# Patient Record
Sex: Male | Born: 1980 | Race: White | Hispanic: No | Marital: Married | Smoking: Never smoker
Health system: Southern US, Community
[De-identification: ages and names within clinical notes are randomized; demographics above are authoritative.]

---

## 2003-02-10 ENCOUNTER — Emergency Department (HOSPITAL_COMMUNITY): Admission: EM | Admit: 2003-02-10 | Discharge: 2003-02-10 | Payer: Self-pay

## 2008-09-03 ENCOUNTER — Emergency Department (HOSPITAL_BASED_OUTPATIENT_CLINIC_OR_DEPARTMENT_OTHER): Admission: EM | Admit: 2008-09-03 | Discharge: 2008-09-03 | Payer: Self-pay | Admitting: Emergency Medicine

## 2008-09-03 ENCOUNTER — Ambulatory Visit: Payer: Self-pay | Admitting: Radiology

## 2008-10-01 ENCOUNTER — Ambulatory Visit: Payer: Self-pay | Admitting: Diagnostic Radiology

## 2008-10-01 ENCOUNTER — Inpatient Hospital Stay (HOSPITAL_COMMUNITY): Admission: AD | Admit: 2008-10-01 | Discharge: 2008-10-11 | Payer: Self-pay | Admitting: Internal Medicine

## 2008-10-01 ENCOUNTER — Encounter: Payer: Self-pay | Admitting: Emergency Medicine

## 2008-10-02 ENCOUNTER — Encounter (INDEPENDENT_AMBULATORY_CARE_PROVIDER_SITE_OTHER): Payer: Self-pay | Admitting: Internal Medicine

## 2008-10-02 ENCOUNTER — Ambulatory Visit: Payer: Self-pay | Admitting: Vascular Surgery

## 2008-10-04 ENCOUNTER — Ambulatory Visit: Payer: Self-pay | Admitting: Infectious Diseases

## 2009-02-03 ENCOUNTER — Ambulatory Visit: Payer: Self-pay | Admitting: Vascular Surgery

## 2010-04-07 LAB — HEPARIN LEVEL (UNFRACTIONATED)
Heparin Unfractionated: 0.12 IU/mL — ABNORMAL LOW (ref 0.30–0.70)
Heparin Unfractionated: 0.14 IU/mL — ABNORMAL LOW (ref 0.30–0.70)
Heparin Unfractionated: 0.67 IU/mL (ref 0.30–0.70)
Heparin Unfractionated: 0.73 IU/mL — ABNORMAL HIGH (ref 0.30–0.70)
Heparin Unfractionated: 0.91 IU/mL — ABNORMAL HIGH (ref 0.30–0.70)
Heparin Unfractionated: 0.98 IU/mL — ABNORMAL HIGH (ref 0.30–0.70)
Heparin Unfractionated: 1.06 IU/mL — ABNORMAL HIGH (ref 0.30–0.70)
Heparin Unfractionated: <0.1 IU/mL — ABNORMAL LOW (ref 0.30–0.70)
Heparin Unfractionated: <0.1 IU/mL — ABNORMAL LOW (ref 0.30–0.70)
Heparin Unfractionated: <0.1 IU/mL — ABNORMAL LOW (ref 0.30–0.70)

## 2010-04-07 LAB — HEPATIC FUNCTION PANEL
ALT: 27 U/L (ref 0–53)
Albumin: 3.2 g/dL — ABNORMAL LOW (ref 3.5–5.2)
Alkaline Phosphatase: 54 U/L (ref 39–117)
Bilirubin, Direct: 0.3 mg/dL (ref 0.0–0.3)
Indirect Bilirubin: 1.3 mg/dL — ABNORMAL HIGH (ref 0.3–0.9)
Total Bilirubin: 1.6 mg/dL — ABNORMAL HIGH (ref 0.3–1.2)

## 2010-04-07 LAB — COMPREHENSIVE METABOLIC PANEL
AST: 15 U/L (ref 0–37)
Albumin: 2.7 g/dL — ABNORMAL LOW (ref 3.5–5.2)
Alkaline Phosphatase: 41 U/L (ref 39–117)
CO2: 27 mEq/L (ref 19–32)
Calcium: 7.9 mg/dL — ABNORMAL LOW (ref 8.4–10.5)
GFR calc Af Amer: >60 mL/min (ref >60)
Glucose, Bld: 104 mg/dL — ABNORMAL HIGH (ref 70–99)
Potassium: 3.3 mEq/L — ABNORMAL LOW (ref 3.5–5.1)
Sodium: 139 mEq/L (ref 135–145)
Total Bilirubin: 0.8 mg/dL (ref 0.3–1.2)
Total Protein: 5.5 g/dL — ABNORMAL LOW (ref 6.0–8.3)

## 2010-04-07 LAB — CBC
HCT: 33 % — ABNORMAL LOW (ref 39.0–52.0)
HCT: 34 % — ABNORMAL LOW (ref 39.0–52.0)
HCT: 36.2 % — ABNORMAL LOW (ref 39.0–52.0)
HCT: 37.5 % — ABNORMAL LOW (ref 39.0–52.0)
HCT: 38.4 % — ABNORMAL LOW (ref 39.0–52.0)
Hemoglobin: 11.7 g/dL — ABNORMAL LOW (ref 13.0–17.0)
Hemoglobin: 12.5 g/dL — ABNORMAL LOW (ref 13.0–17.0)
Hemoglobin: 13 g/dL (ref 13.0–17.0)
Hemoglobin: 13.1 g/dL (ref 13.0–17.0)
Hemoglobin: 13.5 g/dL (ref 13.0–17.0)
Hemoglobin: 13.5 g/dL (ref 13.0–17.0)
MCHC: 34.6 g/dL (ref 30.0–36.0)
MCHC: 34.8 g/dL (ref 30.0–36.0)
MCHC: 35 g/dL (ref 30.0–36.0)
MCHC: 35.1 g/dL (ref 30.0–36.0)
MCHC: 35.3 g/dL (ref 30.0–36.0)
MCHC: 35.4 g/dL (ref 30.0–36.0)
MCHC: 35.5 g/dL (ref 30.0–36.0)
MCHC: 35.6 g/dL (ref 30.0–36.0)
MCV: 90.9 fL (ref 78.0–100.0)
MCV: 91.1 fL (ref 78.0–100.0)
MCV: 91.2 fL (ref 78.0–100.0)
MCV: 91.2 fL (ref 78.0–100.0)
MCV: 91.5 fL (ref 78.0–100.0)
MCV: 91.9 fL (ref 78.0–100.0)
MCV: 92.9 fL (ref 78.0–100.0)
MCV: 92.9 fL (ref 78.0–100.0)
Platelets: 109 K/uL — ABNORMAL LOW (ref 150–400)
Platelets: 119 K/uL — ABNORMAL LOW (ref 150–400)
Platelets: 124 K/uL — ABNORMAL LOW (ref 150–400)
Platelets: 147 10*3/uL — ABNORMAL LOW (ref 150–400)
Platelets: 158 10*3/uL (ref 150–400)
Platelets: 195 10*3/uL (ref 150–400)
Platelets: 218 10*3/uL (ref 150–400)
Platelets: 235 10*3/uL (ref 150–400)
RBC: 3.58 MIL/uL — ABNORMAL LOW (ref 4.22–5.81)
RBC: 3.59 MIL/uL — ABNORMAL LOW (ref 4.22–5.81)
RBC: 3.73 MIL/uL — ABNORMAL LOW (ref 4.22–5.81)
RBC: 3.98 MIL/uL — ABNORMAL LOW (ref 4.22–5.81)
RBC: 4.03 MIL/uL — ABNORMAL LOW (ref 4.22–5.81)
RBC: 4.13 MIL/uL — ABNORMAL LOW (ref 4.22–5.81)
RBC: 4.26 MIL/uL (ref 4.22–5.81)
RDW: 12.9 % (ref 11.5–15.5)
RDW: 13 % (ref 11.5–15.5)
RDW: 13.1 % (ref 11.5–15.5)
RDW: 13.1 % (ref 11.5–15.5)
RDW: 13.4 % (ref 11.5–15.5)
RDW: 13.4 % (ref 11.5–15.5)
RDW: 13.6 % (ref 11.5–15.5)
WBC: 10.7 10*3/uL — ABNORMAL HIGH (ref 4.0–10.5)
WBC: 10.8 K/uL — ABNORMAL HIGH (ref 4.0–10.5)
WBC: 11.8 K/uL — ABNORMAL HIGH (ref 4.0–10.5)
WBC: 6.1 10*3/uL (ref 4.0–10.5)
WBC: 6.9 K/uL (ref 4.0–10.5)
WBC: 7.3 10*3/uL (ref 4.0–10.5)

## 2010-04-07 LAB — BASIC METABOLIC PANEL WITH GFR
BUN: 9 mg/dL (ref 6–23)
CO2: 25 meq/L (ref 19–32)
Calcium: 8 mg/dL — ABNORMAL LOW (ref 8.4–10.5)
Chloride: 104 meq/L (ref 96–112)
Creatinine, Ser: 1.37 mg/dL (ref 0.4–1.5)
GFR calc non Af Amer: >60 mL/min
Glucose, Bld: 97 mg/dL (ref 70–99)
Potassium: 3.2 meq/L — ABNORMAL LOW (ref 3.5–5.1)
Sodium: 136 meq/L (ref 135–145)

## 2010-04-07 LAB — BASIC METABOLIC PANEL
BUN: 5 mg/dL — ABNORMAL LOW (ref 6–23)
BUN: 6 mg/dL (ref 6–23)
BUN: 7 mg/dL (ref 6–23)
CO2: 26 mEq/L (ref 19–32)
CO2: 29 mEq/L (ref 19–32)
CO2: 30 mEq/L (ref 19–32)
Calcium: 8.4 mg/dL (ref 8.4–10.5)
Calcium: 8.4 mg/dL (ref 8.4–10.5)
Calcium: 8.9 mg/dL (ref 8.4–10.5)
Chloride: 103 mEq/L (ref 96–112)
Chloride: 104 mEq/L (ref 96–112)
Chloride: 104 mEq/L (ref 96–112)
Creatinine, Ser: 1.13 mg/dL (ref 0.4–1.5)
Creatinine, Ser: 1.27 mg/dL (ref 0.4–1.5)
Creatinine, Ser: 1.35 mg/dL (ref 0.4–1.5)
Creatinine, Ser: 1.36 mg/dL (ref 0.4–1.5)
GFR calc Af Amer: >60 mL/min (ref >60)
GFR calc non Af Amer: >60 mL/min (ref >60)
Glucose, Bld: 102 mg/dL — ABNORMAL HIGH (ref 70–99)
Glucose, Bld: 97 mg/dL (ref 70–99)
Potassium: 3.2 mEq/L — ABNORMAL LOW (ref 3.5–5.1)
Potassium: 3.3 mEq/L — ABNORMAL LOW (ref 3.5–5.1)
Sodium: 136 mEq/L (ref 135–145)
Sodium: 139 mEq/L (ref 135–145)

## 2010-04-07 LAB — DIFFERENTIAL
Basophils Absolute: 0 K/uL (ref 0.0–0.1)
Basophils Relative: 0 % (ref 0–1)
Eosinophils Absolute: 0 K/uL (ref 0.0–0.7)
Eosinophils Relative: 0 % (ref 0–5)
Lymphocytes Relative: 9 % — ABNORMAL LOW (ref 12–46)
Lymphs Abs: 1.1 K/uL (ref 0.7–4.0)
Monocytes Absolute: 0.9 K/uL (ref 0.1–1.0)
Monocytes Relative: 7 % (ref 3–12)
Neutro Abs: 9.8 K/uL — ABNORMAL HIGH (ref 1.7–7.7)
Neutrophils Relative %: 83 % — ABNORMAL HIGH (ref 43–77)

## 2010-04-07 LAB — CULTURE, ROUTINE-ABSCESS

## 2010-04-07 LAB — CK: Total CK: 107 U/L (ref 7–232)

## 2010-04-07 LAB — PROTIME-INR
INR: 1.23 (ref 0.00–1.49)
Prothrombin Time: 15.4 seconds — ABNORMAL HIGH (ref 11.6–15.2)

## 2010-04-07 LAB — CK TOTAL AND CKMB (NOT AT ARMC)
CK, MB: 1 ng/mL (ref 0.3–4.0)
Relative Index: 1 (ref 0.0–2.5)
Total CK: 104 U/L (ref 7–232)

## 2010-04-07 LAB — VANCOMYCIN, TROUGH: Vancomycin Tr: <5 ug/mL — ABNORMAL LOW (ref 10.0–20.0)

## 2010-04-07 LAB — ANAEROBIC CULTURE

## 2010-04-07 LAB — APTT: aPTT: 30 seconds (ref 24–37)

## 2010-04-08 LAB — URINALYSIS, ROUTINE W REFLEX MICROSCOPIC
Bilirubin Urine: NEGATIVE
Glucose, UA: NEGATIVE mg/dL
Ketones, ur: NEGATIVE mg/dL
Leukocytes, UA: NEGATIVE
Nitrite: NEGATIVE
Protein, ur: NEGATIVE mg/dL
Specific Gravity, Urine: 1.02 (ref 1.005–1.030)
Urobilinogen, UA: 1 mg/dL (ref 0.0–1.0)
pH: 7 (ref 5.0–8.0)

## 2010-04-08 LAB — CULTURE, BLOOD (ROUTINE X 2): Culture: NO GROWTH

## 2010-04-08 LAB — URINE MICROSCOPIC-ADD ON

## 2010-04-08 LAB — CBC
HCT: 45.5 % (ref 39.0–52.0)
Hemoglobin: 15.9 g/dL (ref 13.0–17.0)
MCHC: 34.9 g/dL (ref 30.0–36.0)
MCV: 91.2 fL (ref 78.0–100.0)
Platelets: 139 10*3/uL — ABNORMAL LOW (ref 150–400)
RBC: 4.99 MIL/uL (ref 4.22–5.81)
RDW: 12.4 % (ref 11.5–15.5)
WBC: 14.6 10*3/uL — ABNORMAL HIGH (ref 4.0–10.5)

## 2010-04-08 LAB — BASIC METABOLIC PANEL
BUN: 13 mg/dL (ref 6–23)
CO2: 30 mEq/L (ref 19–32)
Calcium: 9.2 mg/dL (ref 8.4–10.5)
Chloride: 102 mEq/L (ref 96–112)
Creatinine, Ser: 1.3 mg/dL (ref 0.4–1.5)
GFR calc Af Amer: >60 mL/min (ref >60)
GFR calc non Af Amer: >60 mL/min (ref >60)
Glucose, Bld: 78 mg/dL (ref 70–99)
Potassium: 3.8 mEq/L (ref 3.5–5.1)
Sodium: 142 mEq/L (ref 135–145)

## 2010-04-08 LAB — DIFFERENTIAL
Basophils Absolute: 0.6 10*3/uL — ABNORMAL HIGH (ref 0.0–0.1)
Basophils Relative: 4 % — ABNORMAL HIGH (ref 0–1)
Eosinophils Absolute: 0 10*3/uL (ref 0.0–0.7)
Neutro Abs: 12.6 10*3/uL — ABNORMAL HIGH (ref 1.7–7.7)
Neutrophils Relative %: 86 % — ABNORMAL HIGH (ref 43–77)

## 2010-04-08 LAB — SEDIMENTATION RATE: Sed Rate: 3 mm/hr (ref 0–16)

## 2010-05-17 NOTE — Procedures (Signed)
DUPLEX DEEP VENOUS EXAM - LOWER EXTREMITY   INDICATION:  Follow up left lower extremity deep vein thrombosis.   HISTORY:  Edema:  no  Trauma/Surgery:  no  Pain:  no  PE:  no  Previous DVT:  yes  Anticoagulants:  yes  Other:   DUPLEX EXAM:                CFV   SFV   PopV  PTV    GSV                R  L  R  L  R  L  R   L  R  L  Thrombosis    0  0     0     0      0     0  Spontaneous      +     +     +      +     +  Phasic           +     +     +      +     +  Augmentation     +     +     +      +     +  Compressible     +     +     +      +     +  Competent        +     +     +      +     +   Legend:  + - yes  o - no  p - partial  D - decreased   IMPRESSION:  No evidence noted of deep vein thrombosis noted in the left  leg.    _____________________________  Janetta Hora. Fields, MD   MG/MEDQ  D:  02/03/2009  T:  02/04/2009  Job:  [id number]

## 2011-05-13 IMAGING — CT CT EXTREM LOW W/ CM*L*
3 series · 10 of 33 positions shown, 12 images · IV contrast (agent unspecified)
Comparison: Plain films 09/03/2008

CLINICAL DATA: Lower extremity swelling, fever.  Patient struck
with a softball 3 weeks ago.

CT LEFT KNEE WITH CONTRAST
TECHNIQUE: Multidetector CT imaging of the left knee was performed
according to the standard protocol following intravenous contrast
administration. Multiplanar CT image reconstructions were also
generated.
Contrast: 100 ml [id number]-GFF.

[Series 3: knee w/ 2.0 b41s · axial · 0.37mm/px · z∈[-233,-81]mm · 2 of 165 slices shown, 3 images]
[im 51/165  soft-tissue]
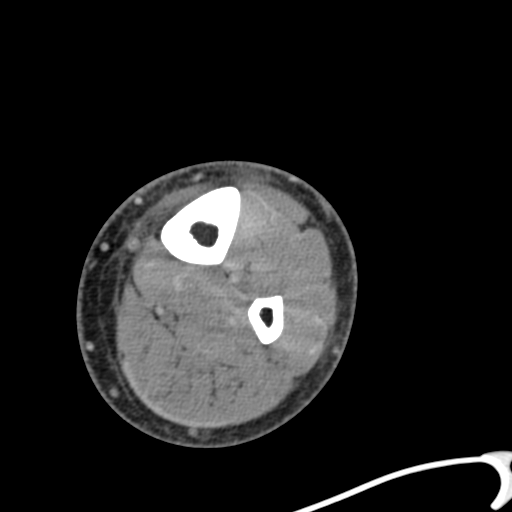
[im 51/165  bone]
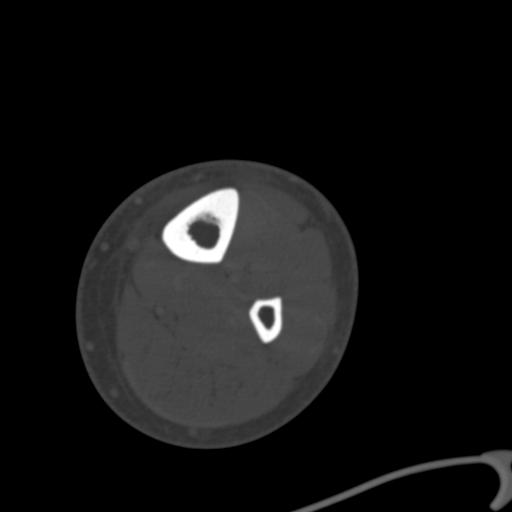
[im 127/165  bone]
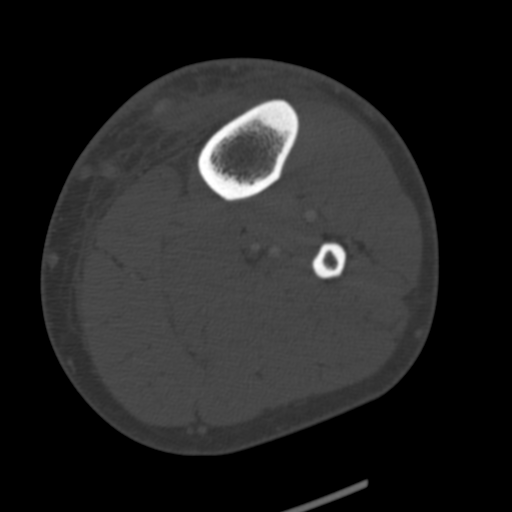

[Series 4: knee w/ 2.0 coronal · coronal · 0.41mm/px · 3 of 93 slices shown]
[im 19/93  bone]
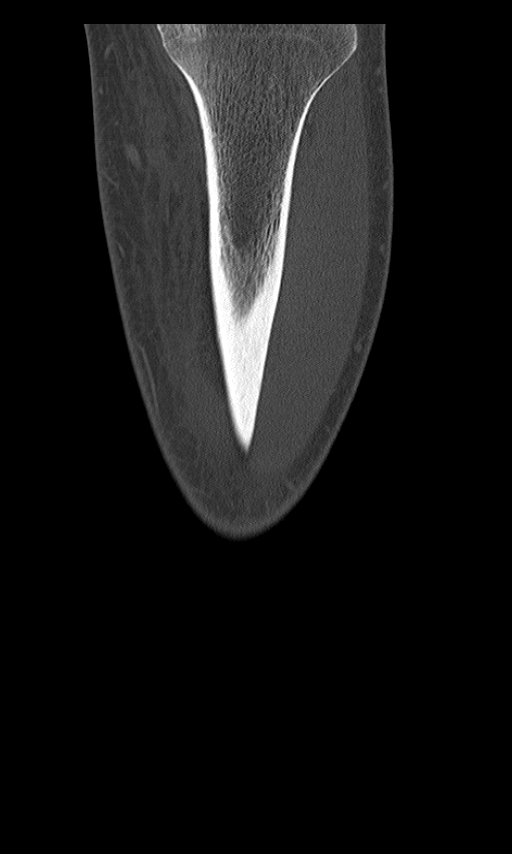
[im 37/93  bone]
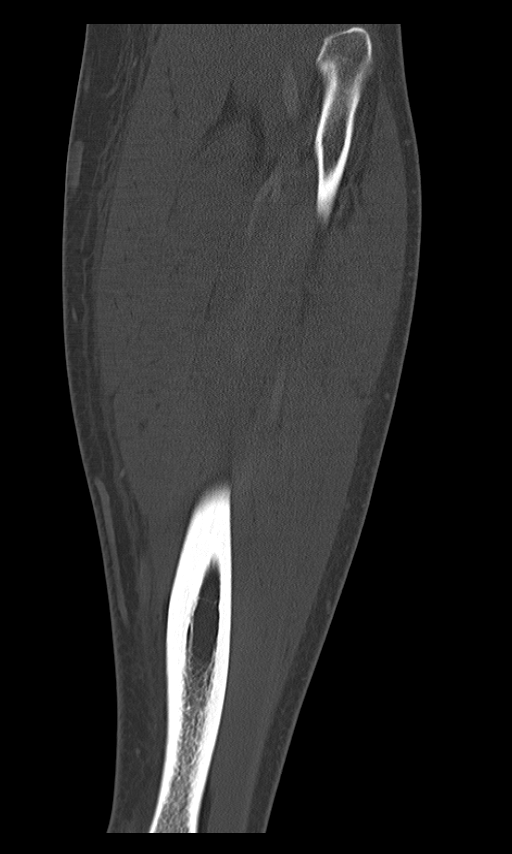
[im 56/93  bone]
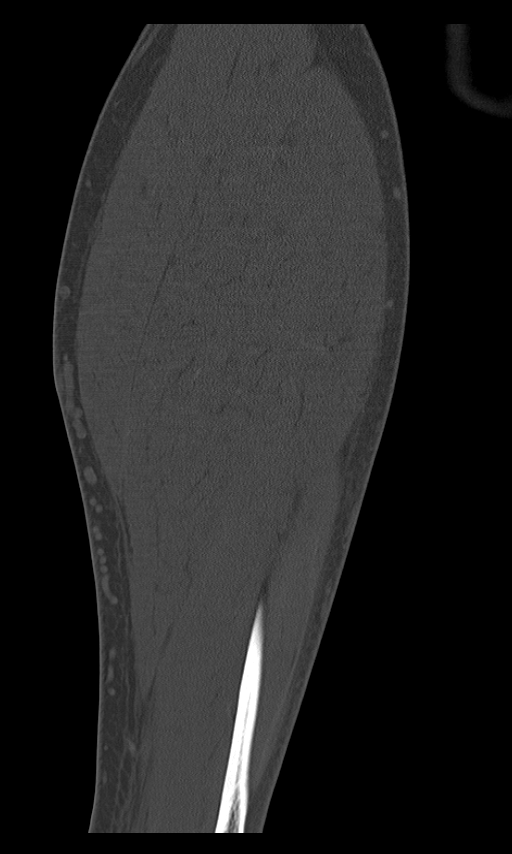

[Series 5: knee w/ 2.0 sagittal · sagittal · 0.34mm/px · 5 of 74 slices shown, 6 images]
[im 25/74  bone]
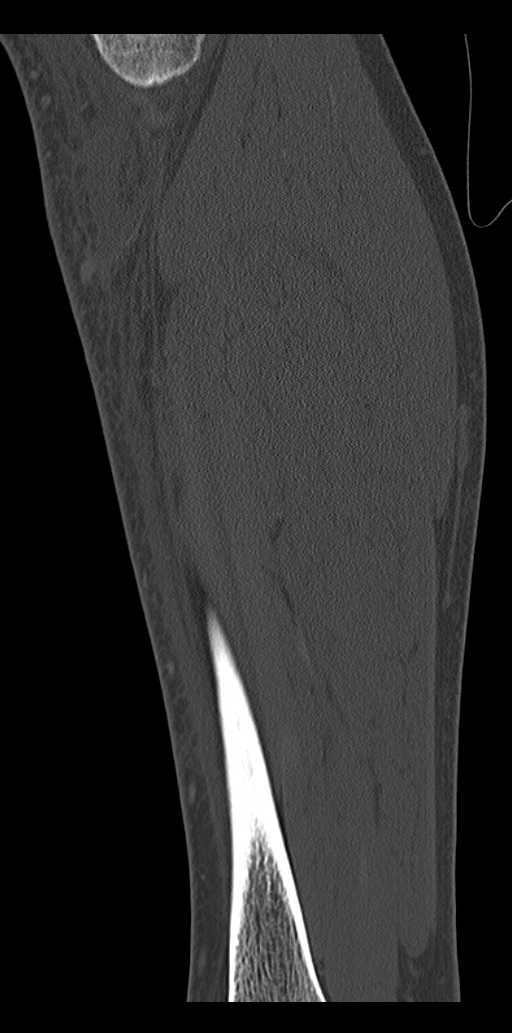
[im 31/74  bone]
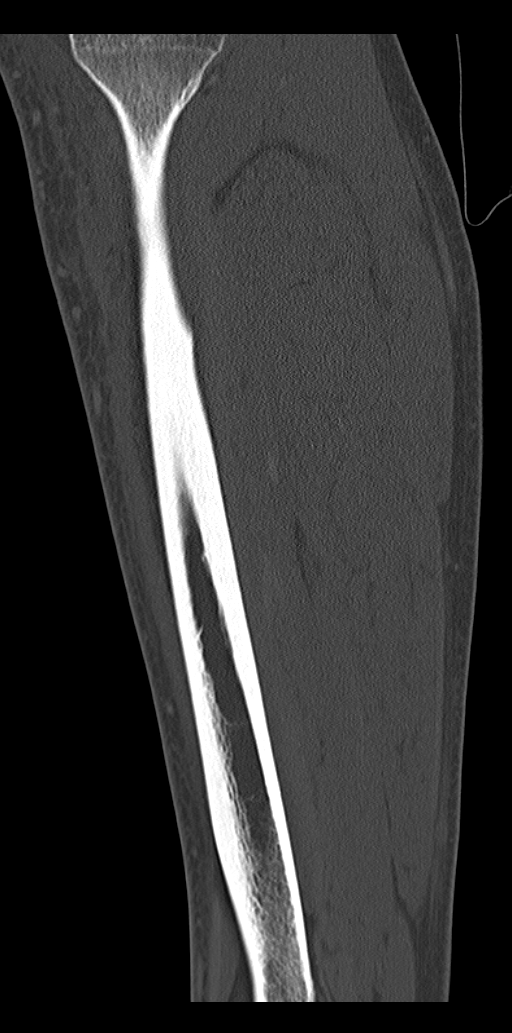
[im 37/74  soft-tissue]
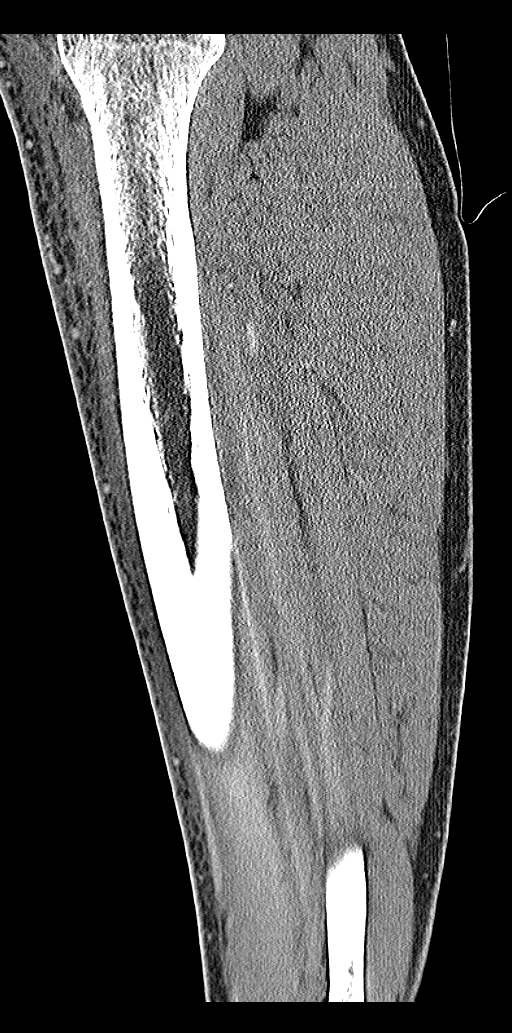
[im 37/74  bone]
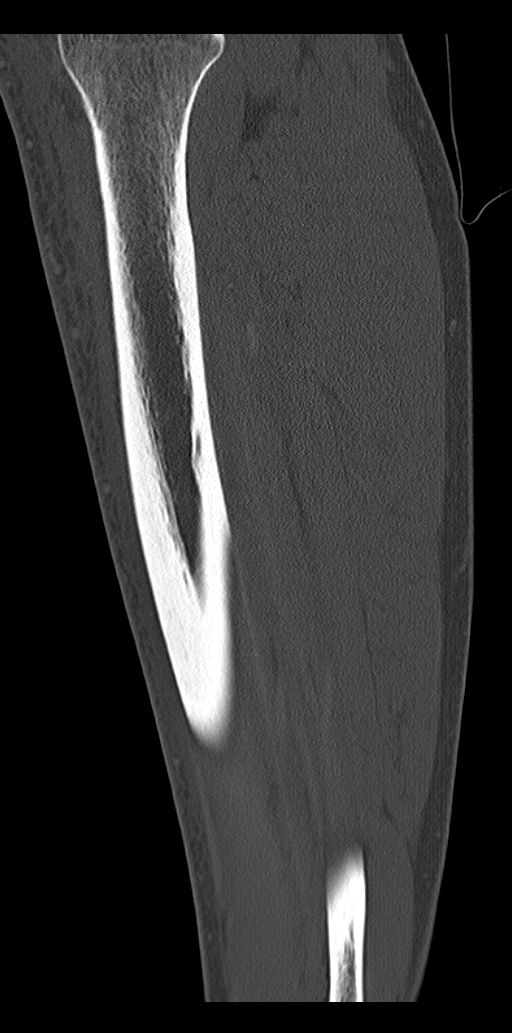
[im 43/74  bone]
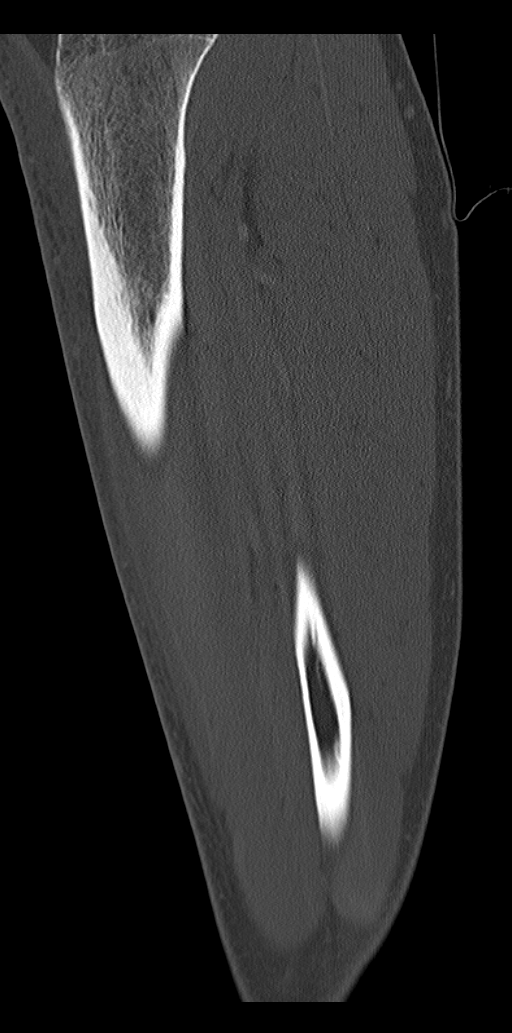
[im 49/74  bone]
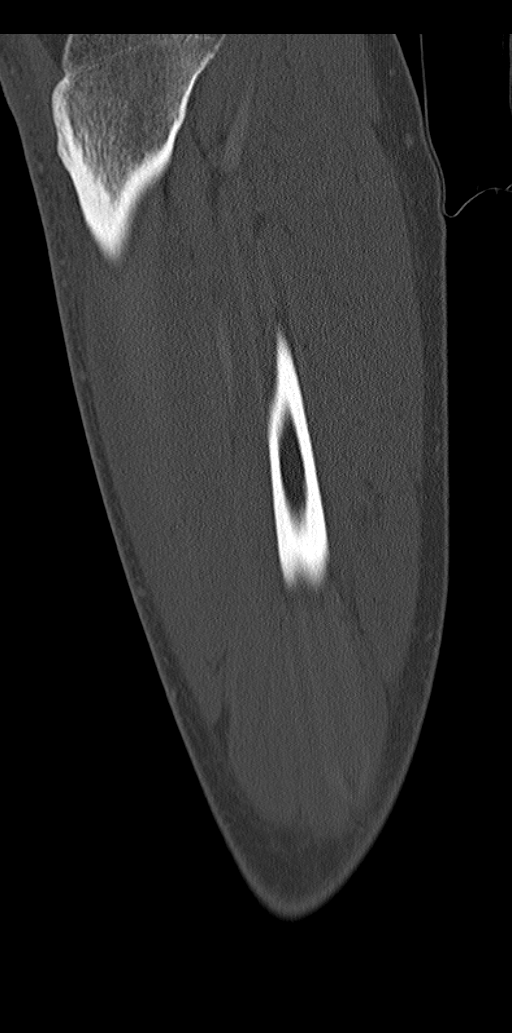

[10 of 33 positions shown; findings below may reference images not displayed]

FINDINGS: A marker is placed in the region of concern over the
anterior, medial aspect of the right lower leg.  Subcutaneous fat
subjacent to the marker and above and below the marker is
infiltrated compatible with contusion and/or cellulitis.  There is
a somewhat more focal component of edema which is compatible with
phlegmon or possibly early abscess measuring 1.4 cm AP by 1.1 cm
transverse by 3.7 cm cranial-caudal. This more focal collection is
centered approximately 6 cm below the medial tibial plateau and is
confined to the subcutaneous fat.  Visualized musculature appears
normal with fat planes preserved and no evidence of tear.  There is
no focal fluid collection within the muscular compartments and no
fascial enhancement is identified.  No bony destructive change is
present to suggest osteomyelitis.  There is no fracture.
IMPRESSION: Infiltration of subcutaneous fat along the anteromedial aspect of
the right lower leg is consistent with contusion and/or cellulitis.
There is a somewhat more focal fluid collection identified
compatible with phlegmon or early abscess formation as described
above.

## 2011-05-19 IMAGING — US US ABSCESS DRAINAGE W/ CATHETER
1 series · 13 of 15 positions shown · non-contrast
Comparison: none

CLINICAL HISTORY: 28-year-old with left anterior shin cellulitis and
concern for an underlying abscess.

[Series 1: us abscess drainage w/ catheter · 0.08mm/px · 13 of 15 slices shown]
[im 1/15]
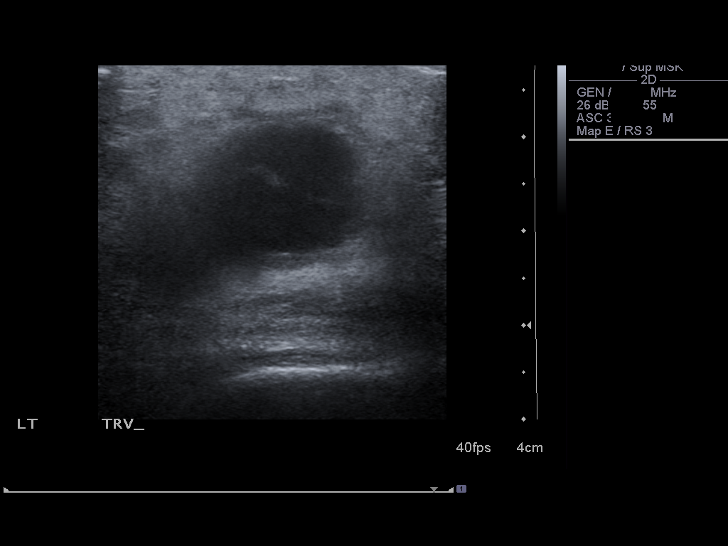
[im 2/15]
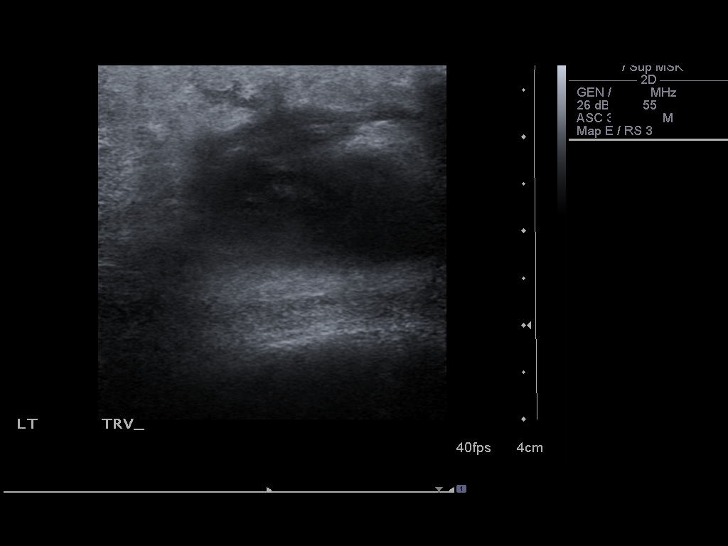
[im 3/15]
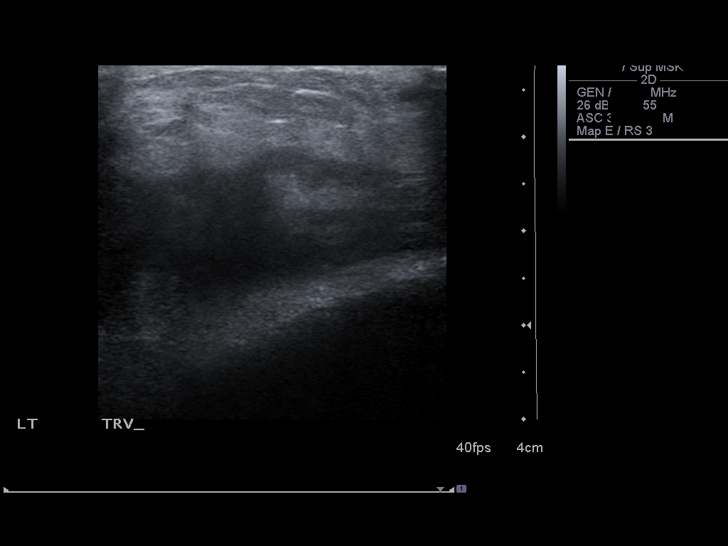
[im 5/15]
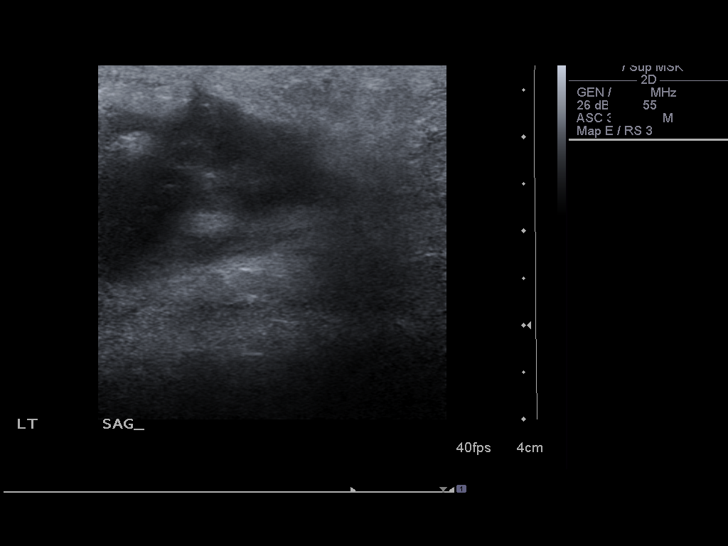
[im 6/15]
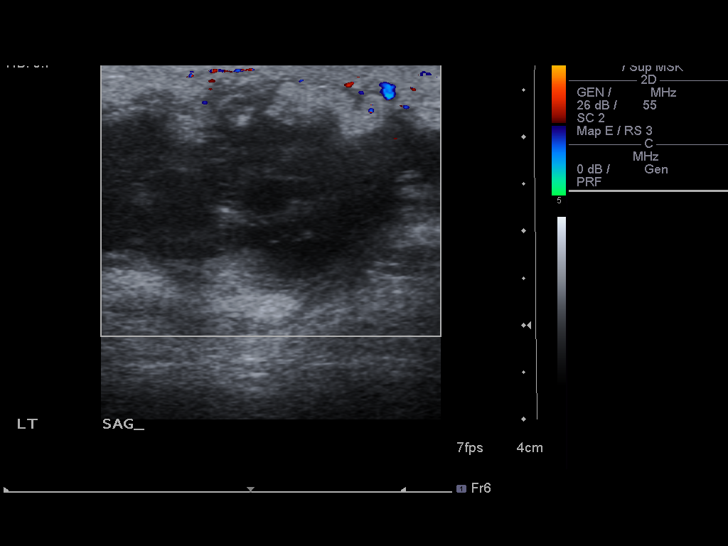
[im 7/15]
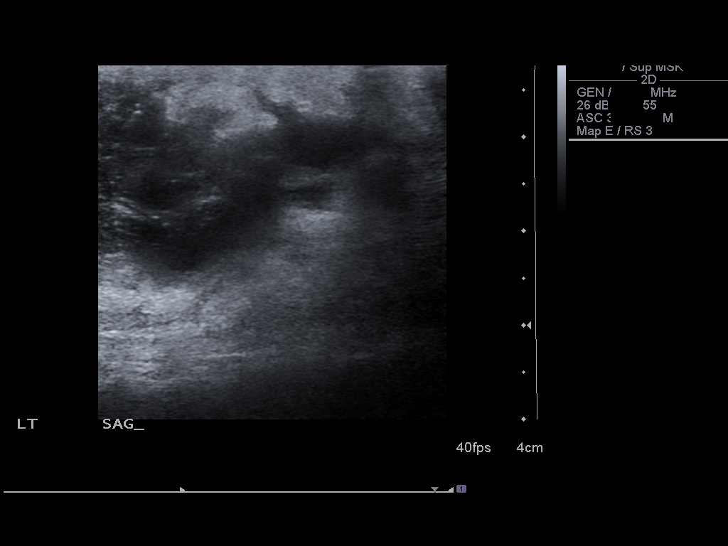
[im 8/15]
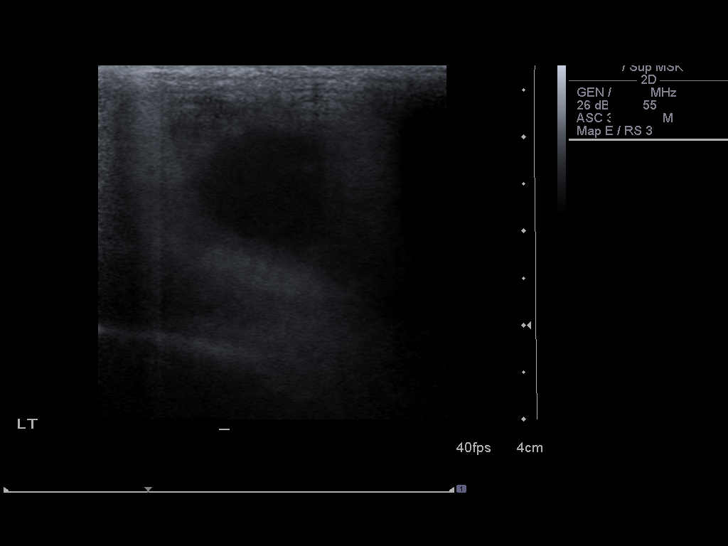
[im 9/15]
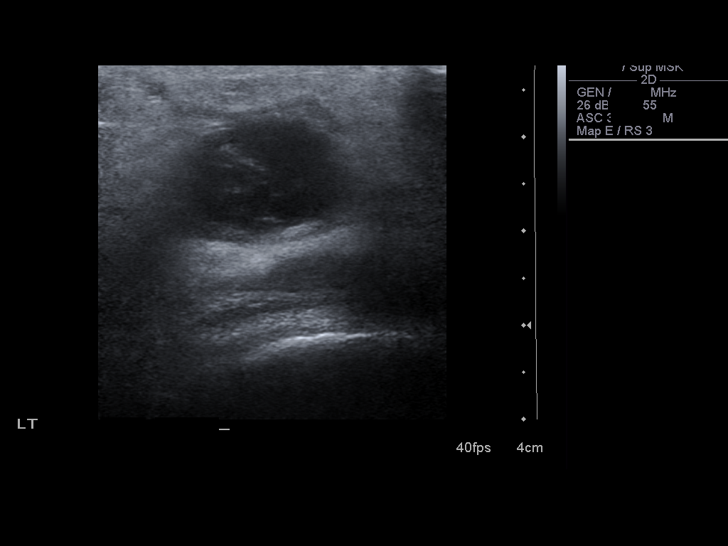
[im 10/15]
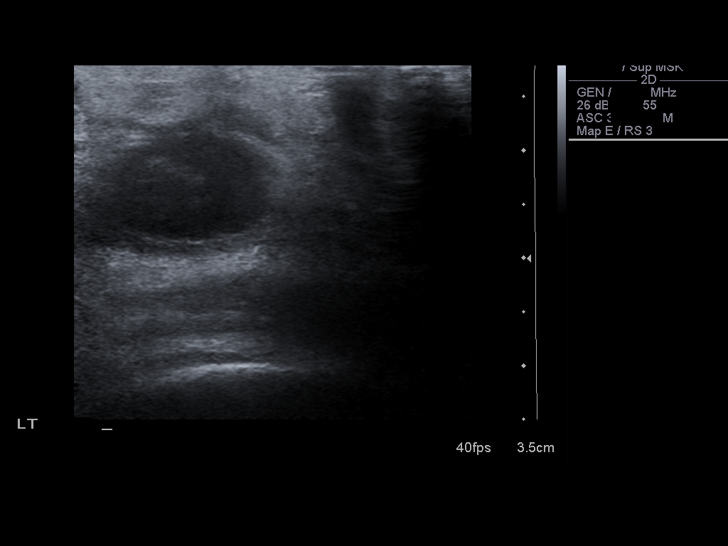
[im 11/15]
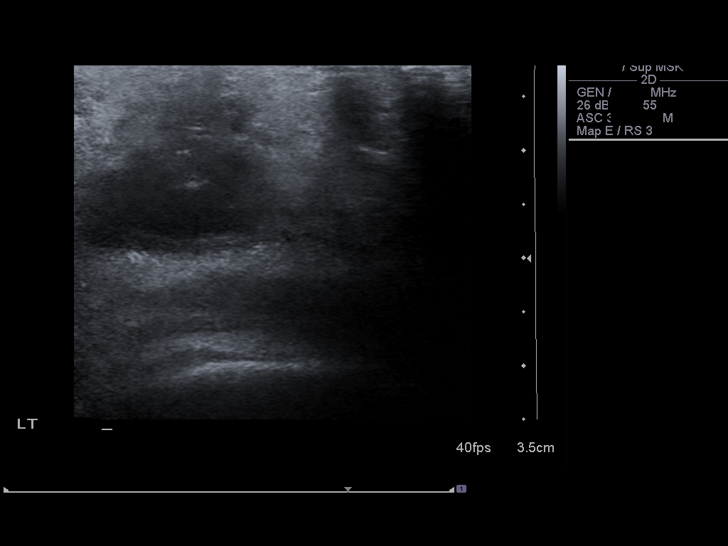
[im 13/15]
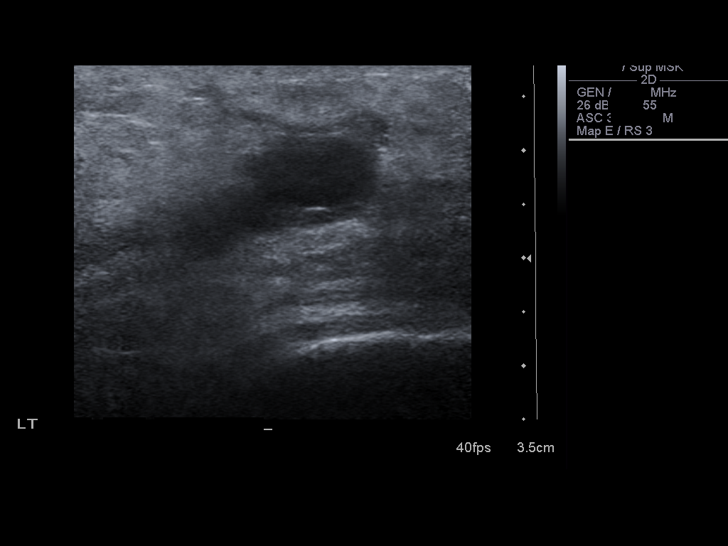
[im 14/15]
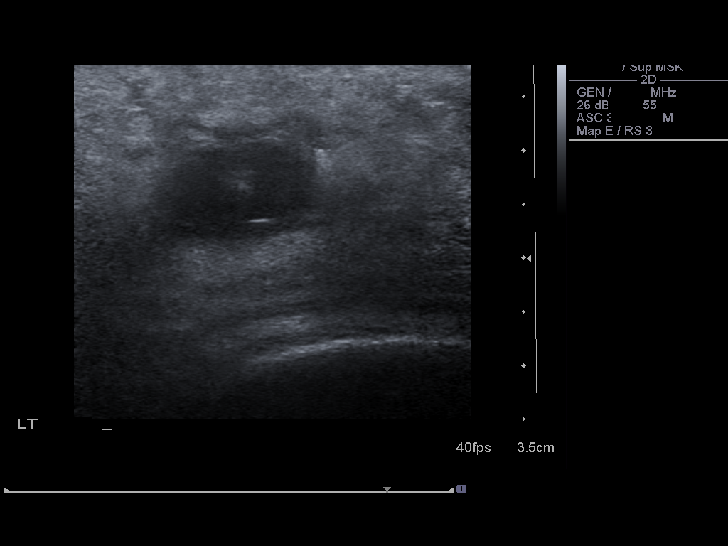
[im 15/15]
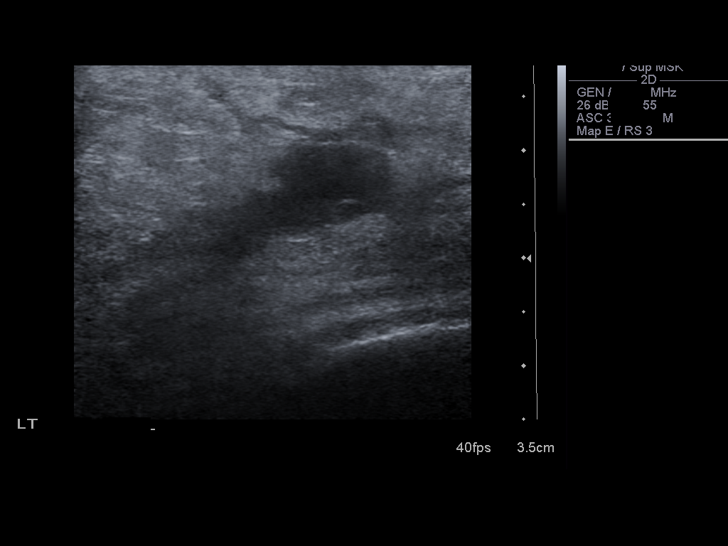

[13 of 15 positions shown; findings below may reference images not displayed]

PROCEDURE(S): ULTRASOUND GUIDED LEFT LOWER LEG ASPIRATION

Medications:Versed 3 mg, Fentanyl 150 mcg. A radiology nurse
monitored the patient for moderate sedation.

Moderate sedation time:26 minutes

Fluoroscopy time: None

Procedure:The procedure was explained to the patient.  The risks
and benefits of the procedure were discussed and the patient's
questions were addressed.  Informed consent was obtained from the
patient. Ultrasound demonstrated irregular hypoechoic collections
along the anterior left shin.  The skin was prepped with Betadine
and a sterile drape was placed.  A 20 gauge spinal needle was
directed into the hypoechoic collection with ultrasound guidance
and a few ml of purulent fluid was removed.  The 20 gauge needle
was exchanged for an 18 gauge needle and additional fluid was
removed.  A total of 11 ml of purulent fluid was aspirated.
FINDINGS: Hypoechoic fluid and edema in the left anterior shin
subcutaneous tissues.  The hypoechoic collections decreased after
aspiration.
IMPRESSION: Ultrasound guided aspiration of a purulent collection in
the left anterior shin.

## 2021-01-18 ENCOUNTER — Ambulatory Visit
Admission: RE | Admit: 2021-01-18 | Discharge: 2021-01-18 | Disposition: A | Discharge status: Home / Self Care | Payer: BC Managed Care – PPO | Source: Ambulatory Visit | Attending: Physician Assistant | Admitting: Physician Assistant

## 2021-01-18 ENCOUNTER — Other Ambulatory Visit: Payer: Self-pay

## 2021-01-18 VITALS — BP 168/97 | HR 79 | Temp 98.1°F | Resp 18

## 2021-01-18 DIAGNOSIS — T148XXA Other injury of unspecified body region, initial encounter: Secondary | ICD-10-CM

## 2021-01-18 DIAGNOSIS — L089 Local infection of the skin and subcutaneous tissue, unspecified: Secondary | ICD-10-CM

## 2021-01-18 MED ORDER — KETOROLAC TROMETHAMINE 60 MG/2ML IM SOLN
60.0000 mg | Freq: Once | INTRAMUSCULAR | Status: AC
  Administered 2021-01-18: 60 mg via INTRAMUSCULAR

## 2021-01-18 MED ORDER — MUPIROCIN 2 % EX OINT: 1.0000 "application " | TOPICAL_OINTMENT | Freq: Two times a day (BID) | CUTANEOUS | 0 refills | Status: AC

## 2021-01-18 MED ORDER — DOXYCYCLINE HYCLATE 100 MG PO CAPS: 100.0000 mg | ORAL_CAPSULE | Freq: Two times a day (BID) | ORAL | 0 refills | Status: AC

## 2021-01-18 NOTE — ED Provider Notes (Signed)
[facility]    CSN: [id number] Arrival date & time: 01/18/21  1244      History   Chief Complaint Chief Complaint  Patient presents with   Wound Check    HPI Paul Hanna is a 41 y.o. male.   Patient here today for evaluation of wound to his left shin that does not seem to be healing. He reports wound occurred after scraping his leg on a cement wall. He has not had any fever. He reports pain to the area. He has had some drainage. He has tried neosporin without improvement.  The history is provided by the patient.  Wound Check Pertinent negatives include no shortness of breath.   History reviewed. No pertinent past medical history.  There are no problems to display for this patient.   History reviewed. No pertinent surgical history.     Home Medications    Prior to Admission medications   Medication Sig Start Date End Date Taking? Authorizing Provider  doxycycline (VIBRAMYCIN) 100 MG capsule Take 1 capsule (100 mg total) by mouth 2 (two) times daily. 01/18/21  Yes Tomi Bamberger, PA-C  mupirocin ointment (BACTROBAN) 2 % Apply 1 application topically 2 (two) times daily for 7 days. 01/18/21 01/25/21 Yes Tomi Bamberger, PA-C    Family History History reviewed. No pertinent family history.  Social History     Allergies   Patient has no known allergies.   Review of Systems Review of Systems  Constitutional:  Negative for chills and fever.  Eyes:  Negative for discharge and redness.  Respiratory:  Negative for shortness of breath.   Gastrointestinal:  Negative for nausea and vomiting.  Skin:  Positive for color change and wound.  Neurological:  Negative for numbness.    Physical Exam Triage Vital Signs ED Triage Vitals  Enc Vitals Group     BP 01/18/21 1303 (!) 168/97     Pulse Rate 01/18/21 1303 79     Resp 01/18/21 1303 18     Temp 01/18/21 1303 98.1 F (36.7 C)     Temp Source 01/18/21 1303 Oral     SpO2 01/18/21 1303 98 %      Weight --      Height --      Head Circumference --      Peak Flow --      Pain Score 01/18/21 1304 3     Pain Loc --      Pain Edu? --      Excl. in GC? --    No data found.  Updated Vital Signs BP (!) 168/97 (BP Location: Left Arm)    Pulse 79    Temp 98.1 F (36.7 C) (Oral)    Resp 18    SpO2 98%     Physical Exam Vitals and nursing note reviewed.  Constitutional:      General: He is not in acute distress.    Appearance: Normal appearance. He is not ill-appearing.  HENT:     Head: Normocephalic and atraumatic.  Eyes:     Conjunctiva/sclera: Conjunctivae normal.  Cardiovascular:     Rate and Rhythm: Normal rate.  Pulmonary:     Effort: Pulmonary effort is normal.  Skin:    Comments: Approx 3 inch diameter ulceration to left lower anterior leg with mild surrounding erythema and minimal purulent drainage.  Neurological:     Mental Status: He is alert.  Psychiatric:        Mood and Affect: Mood  normal.        Behavior: Behavior normal.        Thought Content: Thought content normal.     UC Treatments / Results  Labs (all labs ordered are listed, but only abnormal results are displayed) Labs Reviewed - No data to display  EKG   Radiology No results found.  Procedures Procedures (including critical care time)  Medications Ordered in UC Medications  ketorolac (TORADOL) injection 60 mg (60 mg Intramuscular Given 01/18/21 1324)    Initial Impression / Assessment and Plan / UC Course  I have reviewed the triage vital signs and the nursing notes.  Pertinent labs & imaging results that were available during my care of the patient were reviewed by me and considered in my medical decision making (see chart for details).    Will treat to cover infection with doxycycline and mupirocin ointment. Recommend follow up with any further concerns. Appointment made to establish care with local PCP within the next week.   Final Clinical Impressions(s) / UC Diagnoses    Final diagnoses:  Wound infection   Discharge Instructions   None    ED Prescriptions     Medication Sig Dispense Auth. Provider   doxycycline (VIBRAMYCIN) 100 MG capsule Take 1 capsule (100 mg total) by mouth 2 (two) times daily. 20 capsule Erma Pinto F, PA-C   mupirocin ointment (BACTROBAN) 2 % Apply 1 application topically 2 (two) times daily for 7 days. 22 g Tomi Bamberger, PA-C      PDMP not reviewed this encounter.   Tomi Bamberger, PA-C 01/18/21 1418

## 2021-01-18 NOTE — ED Triage Notes (Signed)
Pt here for wound check for wound not healing to left shin x 5 weeks; some drainage and redness noted

## 2021-01-27 ENCOUNTER — Ambulatory Visit (INDEPENDENT_AMBULATORY_CARE_PROVIDER_SITE_OTHER): Payer: BC Managed Care – PPO | Admitting: Nurse Practitioner

## 2021-01-27 ENCOUNTER — Other Ambulatory Visit: Payer: Self-pay

## 2021-01-27 ENCOUNTER — Encounter: Payer: Self-pay | Admitting: Nurse Practitioner

## 2021-01-27 VITALS — BP 136/88 | HR 77 | Temp 97.9°F | Ht 71.0 in | Wt 305.7 lb

## 2021-01-27 DIAGNOSIS — L03116 Cellulitis of left lower limb: Secondary | ICD-10-CM | POA: Insufficient documentation

## 2021-01-27 DIAGNOSIS — T148XXA Other injury of unspecified body region, initial encounter: Secondary | ICD-10-CM | POA: Insufficient documentation

## 2021-01-27 DIAGNOSIS — Z6841 Body Mass Index (BMI) 40.0 and over, adult: Secondary | ICD-10-CM

## 2021-01-27 DIAGNOSIS — Z7689 Persons encountering health services in other specified circumstances: Secondary | ICD-10-CM | POA: Diagnosis not present

## 2021-01-27 DIAGNOSIS — M79605 Pain in left leg: Secondary | ICD-10-CM | POA: Diagnosis not present

## 2021-01-27 DIAGNOSIS — L409 Psoriasis, unspecified: Secondary | ICD-10-CM

## 2021-01-27 MED ORDER — CIPROFLOXACIN HCL 500 MG PO TABS: 500.0000 mg | ORAL_TABLET | Freq: Two times a day (BID) | ORAL | 0 refills | Status: AC

## 2021-01-27 MED ORDER — MUPIROCIN 2 % EX OINT: TOPICAL_OINTMENT | CUTANEOUS | 1 refills | Status: AC

## 2021-01-27 MED ORDER — CELECOXIB 200 MG PO CAPS: 200.0000 mg | ORAL_CAPSULE | Freq: Two times a day (BID) | ORAL | 1 refills | Status: AC

## 2021-01-27 MED ORDER — TRIAMCINOLONE ACETONIDE 0.1 % EX OINT: 1.0000 "application " | TOPICAL_OINTMENT | Freq: Two times a day (BID) | CUTANEOUS | 2 refills | Status: AC

## 2021-01-27 NOTE — Patient Instructions (Signed)
Fat and Cholesterol Restricted Eating Plan Getting too much fat and cholesterol in your diet may cause health problems. Choosing the right foods helps keep your fat and cholesterol at normal levels. This can keep you from getting certain diseases. Your doctor may recommend an eating plan that includes: Total fat: ______% or less of total calories a day. This is ______g of fat a day. Saturated fat: ______% or less of total calories a day. This is ______g of saturated fat a day. Cholesterol: less than _________mg a day. Fiber: ______g a day. What are tips for following this plan? General tips Work with your doctor to lose weight if you need to. Avoid: Foods with added sugar. Fried foods. Foods with trans fat or partially hydrogenated oils. This includes some margarines and baked goods. If you drink alcohol: Limit how much you have to: 0-1 drink a day for women who are not pregnant. 0-2 drinks a day for men. Know how much alcohol is in a drink. In the [place] one drink equals one 12 oz bottle of beer (355 mL), one 5 oz glass of wine (148 mL), or one 1 oz glass of hard liquor (44 mL). Reading food labels Check food labels for: Trans fats. Partially hydrogenated oils. Saturated fat (g) in each serving. Cholesterol (mg) in each serving. Fiber (g) in each serving. Choose foods with healthy fats, such as: Monounsaturated fats and polyunsaturated fats. These include olive and canola oil, flaxseeds, walnuts, almonds, and seeds. Omega-3 fats. These are found in certain fish, flaxseed oil, and ground flaxseeds. Choose grain products that have whole grains. Look for the word "whole" as the first word in the ingredient list. Cooking Cook foods using low-fat methods. These include baking, boiling, grilling, and broiling. Eat more home-cooked foods. Eat at restaurants and buffets less often. Eat less fast food. Avoid cooking using saturated fats, such as butter, cream, palm oil, palm kernel oil, and  coconut oil. Meal planning  At meals, divide your plate into four equal parts: Fill one-half of your plate with vegetables, green salads, and fruit. Fill one-fourth of your plate with whole grains. Fill one-fourth of your plate with low-fat (lean) protein foods. Eat fish that is high in omega-3 fats at least two times a week. This includes mackerel, tuna, sardines, and salmon. Eat foods that are high in fiber, such as whole grains, beans, apples, pears, berries, broccoli, carrots, peas, and barley. What foods should I eat? Fruits All fresh, canned (in natural juice), or frozen fruits. Vegetables Fresh or frozen vegetables (raw, steamed, roasted, or grilled). Green salads. Grains Whole grains, such as whole wheat or whole grain breads, crackers, cereals, and pasta. Unsweetened oatmeal, bulgur, barley, quinoa, or brown rice. Corn or whole wheat flour tortillas. Meats and other protein foods Ground beef (85% or leaner), grass-fed beef, or beef trimmed of fat. Skinless chicken or turkey. Ground chicken or turkey. Pork trimmed of fat. All fish and seafood. Egg whites. Dried beans, peas, or lentils. Unsalted nuts or seeds. Unsalted canned beans. Nut butters without added sugar or oil. Dairy Low-fat or nonfat dairy products, such as skim or 1% milk, 2% or reduced-fat cheeses, low-fat and fat-free ricotta or cottage cheese, or plain low-fat and nonfat yogurt. Fats and oils Tub margarine without trans fats. Light or reduced-fat mayonnaise and salad dressings. Avocado. Olive, canola, sesame, or safflower oils. The items listed above may not be a complete list of foods and beverages you can eat. Contact a dietitian for more information. What foods   should I avoid? Fruits Canned fruit in heavy syrup. Fruit in cream or butter sauce. Fried fruit. Vegetables Vegetables cooked in cheese, cream, or butter sauce. Fried vegetables. Grains White bread. White pasta. White rice. Cornbread. Bagels, pastries,  and croissants. Crackers and snack foods that contain trans fat and hydrogenated oils. Meats and other protein foods Fatty cuts of meat. Ribs, chicken wings, bacon, sausage, bologna, salami, chitterlings, fatback, hot dogs, bratwurst, and packaged lunch meats. Liver and organ meats. Whole eggs and egg yolks. Chicken and turkey with skin. Fried meat. Dairy Whole or 2% milk, cream, half-and-half, and cream cheese. Whole milk cheeses. Whole-fat or sweetened yogurt. Full-fat cheeses. Nondairy creamers and whipped toppings. Processed cheese, cheese spreads, and cheese curds. Fats and oils Butter, stick margarine, lard, shortening, ghee, or bacon fat. Coconut, palm kernel, and palm oils. Beverages Alcohol. Sugar-sweetened drinks such as sodas, lemonade, and fruit drinks. Sweets and desserts Corn syrup, sugars, honey, and molasses. Candy. Jam and jelly. Syrup. Sweetened cereals. Cookies, pies, cakes, donuts, muffins, and ice cream. The items listed above may not be a complete list of foods and beverages you should avoid. Contact a dietitian for more information. Summary Choosing the right foods helps keep your fat and cholesterol at normal levels. This can keep you from getting certain diseases. At meals, fill one-half of your plate with vegetables, green salads, and fruits. Eat high fiber foods, like whole grains, beans, apples, pears, berries, carrots, peas, and barley. Limit added sugar, saturated fats, alcohol, and fried foods. This information is not intended to replace advice given to you by your health care provider. Make sure you discuss any questions you have with your health care provider. Document Revised: 04/30/2020 Document Reviewed: 04/30/2020 Elsevier Patient Education  2022 [employer]vier Inc.  
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
## 2021-02-09 ENCOUNTER — Other Ambulatory Visit
Admission: RE | Admit: 2021-02-09 | Discharge: 2021-02-09 | Disposition: A | Discharge status: Home / Self Care | Payer: BC Managed Care – PPO | Source: Ambulatory Visit | Attending: Internal Medicine | Admitting: Internal Medicine

## 2021-02-09 ENCOUNTER — Other Ambulatory Visit: Payer: Self-pay

## 2021-02-09 ENCOUNTER — Encounter: Payer: BC Managed Care – PPO | Attending: Internal Medicine | Admitting: Internal Medicine

## 2021-02-09 DIAGNOSIS — L97822 Non-pressure chronic ulcer of other part of left lower leg with fat layer exposed: Secondary | ICD-10-CM | POA: Insufficient documentation

## 2021-02-09 DIAGNOSIS — G8929 Other chronic pain: Secondary | ICD-10-CM | POA: Insufficient documentation

## 2021-02-09 DIAGNOSIS — L03116 Cellulitis of left lower limb: Secondary | ICD-10-CM | POA: Insufficient documentation

## 2021-02-09 DIAGNOSIS — T798XXA Other early complications of trauma, initial encounter: Secondary | ICD-10-CM | POA: Diagnosis not present

## 2021-02-09 DIAGNOSIS — W228XXA Striking against or struck by other objects, initial encounter: Secondary | ICD-10-CM | POA: Diagnosis not present

## 2021-02-09 DIAGNOSIS — B999 Unspecified infectious disease: Secondary | ICD-10-CM | POA: Insufficient documentation

## 2021-02-09 DIAGNOSIS — S80812A Abrasion, left lower leg, initial encounter: Secondary | ICD-10-CM | POA: Insufficient documentation

## 2021-02-09 DIAGNOSIS — I87312 Chronic venous hypertension (idiopathic) with ulcer of left lower extremity: Secondary | ICD-10-CM | POA: Insufficient documentation

## 2021-02-09 NOTE — Progress Notes (Signed)
LAURA, RADILLA ([id number]) Visit Report for 02/09/2021 Chief Complaint Document Details Patient Name: Paul Hanna, Paul Hanna Date of Service: 02/09/2021 10:00 AM Medical Record Number: [id number] Patient Account Number: [id number] Date of Birth/Sex: 02-04-1980 (41 y.o. M) Treating RN: Donnamarie Poag Primary Care Provider: Leretha Pol Other Clinician: Referring Provider: Leretha Pol Treating Provider/Extender: Yaakov Guthrie in Treatment: 0 Information Obtained from: Patient Chief Complaint Left lower extremity wound due to trauma Electronic Signature(s) Signed: 02/09/2021 11:55:16 AM By: Paul Hanna Entered By: Paul Shan on 02/09/2021 11:46:18 Paul Hanna ([id number]) -------------------------------------------------------------------------------- Debridement Details Patient Name: Paul Hanna Date of Service: 02/09/2021 10:00 AM Medical Record Number: [id number] Patient Account Number: [id number] Date of Birth/Sex: August 07, 1980 (41 y.o. M) Treating RN: Donnamarie Poag Primary Care Provider: Leretha Pol Other Clinician: Referring Provider: Leretha Pol Treating Provider/Extender: Yaakov Guthrie in Treatment: 0 Debridement Performed for Wound #1 Left Lower Leg Assessment: Performed By: Physician Paul Shan, MD Debridement Type: Debridement Level of Consciousness (Pre- Awake and Alert procedure): Pre-procedure Verification/Time Out Yes - 11:12 Taken: Start Time: 11:13 Pain Control: Lidocaine Total Area Debrided (L x W): 2.1 (cm) x 1.5 (cm) = 3.15 (cm) Tissue and other material Viable, Non-Viable, Slough, Subcutaneous, Slough debrided: Level: Skin/Subcutaneous Tissue Debridement Description: Excisional Instrument: Curette Specimen: Swab, Number of Specimens Taken: 1 Bleeding: Minimum Hemostasis Achieved: Pressure Response to Treatment: Procedure was tolerated well Level of Consciousness (Post- Awake and Alert procedure): Post Debridement  Measurements of Total Wound Length: (cm) 2.1 Width: (cm) 1.5 Depth: (cm) 0.1 Volume: (cm) 0.247 Character of Wound/Ulcer Post Debridement: Improved Post Procedure Diagnosis Same as Pre-procedure Electronic Signature(s) Signed: 02/09/2021 11:55:16 AM By: Paul Hanna Signed: 02/09/2021 4:26:12 PM By: Donnamarie Poag Entered By: Donnamarie Poag on 02/09/2021 11:15:58 Paul Hanna ([id number]) -------------------------------------------------------------------------------- HPI Details Patient Name: Paul Hanna Date of Service: 02/09/2021 10:00 AM Medical Record Number: [id number] Patient Account Number: [id number] Date of Birth/Sex: 06/19/1980 (41 y.o. M) Treating RN: Donnamarie Poag Primary Care Provider: Leretha Pol Other Clinician: Referring Provider: Leretha Pol Treating Provider/Extender: Yaakov Guthrie in Treatment: 0 History of Present Illness HPI Description: Admission 02/09/2021 Paul Hanna is a 41 year old male otherwise healthy that presents to the clinic for an abrasion to the left leg caused by a cement wall on Halloween night. He originally took care of the wound and use Neosporin and kept the area clean however it has not healed. He has chronic pain to the area. He visited the ED on 01/18/2021 for this issue and they treated him with doxycycline and mupirocin ointment. He completed treatment and followed up with his family medicine office Who started him on ciprofloxacin. He also completed this course. He reports no improvement to the wound site after these courses of antibiotics. He denies ever having an x-ray of the area. 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 He is on his feet most of the day. Electronic Signature(s) Signed: 02/09/2021 11:55:16 AM By: Paul Hanna Entered By: Paul Shan on 02/09/2021 11:51:01 Paul Hanna ([id number]) -------------------------------------------------------------------------------- Physical Exam Details Patient  Name: Paul Hanna Date of Service: 02/09/2021 10:00 AM Medical Record Number: [id number] Patient Account Number: [id number] Date of Birth/Sex: Sep 23, 1980 (41 y.o. M) Treating RN: Donnamarie Poag Primary Care Provider: Leretha Pol Other Clinician: Referring Provider: Leretha Pol Treating Provider/Extender: Yaakov Guthrie in Treatment: 0 Constitutional . Cardiovascular . Psychiatric . Notes Left lower extremity: To the anterior aspect there is an open wound with nonviable tissue throughout. No surrounding signs of infection. Electronic Signature(s) Signed: 02/09/2021 11:55:16 AM By:  Paul Hanna Entered By: Paul Shan on 02/09/2021 11:52:18 Paul Hanna ([id number]) -------------------------------------------------------------------------------- Physician Orders Details Patient Name: Paul Hanna Date of Service: 02/09/2021 10:00 AM Medical Record Number: [id number] Patient Account Number: [id number] Date of Birth/Sex: 04-Jan-1980 (41 y.o. M) Treating RN: Donnamarie Poag Primary Care Provider: Leretha Pol Other Clinician: Referring Provider: Leretha Pol Treating Provider/Extender: Yaakov Guthrie in Treatment: 0 Verbal / Phone Orders: No Diagnosis Coding Follow-up Appointments o Return Appointment in 1 week. Bathing/ Shower/ Hygiene o May shower; gently cleanse wound with antibacterial soap, rinse and pat dry prior to dressing wounds - change dressing after shower o No tub bath. Anesthetic (Use 'Patient Medications' Section for Anesthetic Order Entry) o Lidocaine applied to wound bed Edema Control - Lymphedema / Segmental Compressive Device / Other o Tubigrip single layer applied. - Size E applied o Patient to wear own compression stockings. Remove compression stockings every night before going to bed and put on every morning when getting up. o Elevate leg(s) parallel to the floor when sitting. Additional Orders / Instructions o  Follow Nutritious Diet and Increase Protein Intake Medications-Please add to medication list. o Take one 537m Tylenol (Acetaminophen) and one 2049mMotrin (Ibuprofen) every 6 hours for pain. Hanna not take ibuprofen if you are on blood thinners or have stomach ulcers. o Topical Antibiotic - Pick up Gentamycin cream at pharmacy to apply for wound care as directed Wound Treatment Wound #1 - Lower Leg Wound Laterality: Left Cleanser: Byram Ancillary Kit - 15 Day Supply (DME) (Generic) 1 x Per Day/15 Days Discharge Instructions: Use supplies as instructed; Kit contains: (15) Saline Bullets; (15) 3x3 Gauze; 15 pr Gloves Cleanser: Normal Saline 1 x Per Day/15 Days Discharge Instructions: Wash your hands with soap and water. Remove old dressing, discard into plastic bag and place into trash. Cleanse the wound with Normal Saline prior to applying a clean dressing using gauze sponges, not tissues or cotton balls. Hanna not scrub or use excessive force. Pat dry using gauze sponges, not tissue or cotton balls. Cleanser: Soap and Water 1 x Per Day/15 Days Discharge Instructions: Gently cleanse wound with antibacterial soap, rinse and pat dry prior to dressing wounds Cleanser: Wound Cleanser 1 x Per Day/15 Days Discharge Instructions: Wash your hands with soap and water. Remove old dressing, discard into plastic bag and place into trash. Cleanse the wound with Wound Cleanser prior to applying a clean dressing using gauze sponges, not tissues or cotton balls. Hanna not scrub or use excessive force. Pat dry using gauze sponges, not tissue or cotton balls. Primary Dressing: Hydrofera Blue Ready Transfer Foam, 4x5 (in/in) 1 x Per Day/15 Days Discharge Instructions: Apply Hydrofera Blue Ready to wound bed as directed Secondary Dressing: Zetuvit Plus Silicone Border Dressing 5x5 (in/in) (DME) (Generic) 1 x Per Day/15 Days Laboratory o Bacteria identified in Wound by Culture (MICRO) - culture of left lower  leg oooo LOINC Code: 644132-4ooo Convenience Name: Wound culture routine BRHomewoodJULarkin [id number] Medications Allergies: No Known Allergies Notifications Medication Indication Start End gentamicin 02/09/2021 DOSE 1 - topical 0.1 % cream - 1 application daily Electronic Signature(s) Signed: 02/09/2021 11:41:50 AM By: HoKalman ShanO Entered By: HoKalman Shann 02/09/2021 11:41:49 BRWandra [id number]-------------------------------------------------------------------------------- Problem List Details Patient Name: BRWandra Feinsteinate of Service: 02/09/2021 10:00 AM Medical Record Number: [id number] Account Number: [id number] of Birth/Sex: [birth year].o. 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 M) Treating RN: Donnamarie Poag Primary Care Provider: Leretha Pol Other Clinician: Referring Provider: Leretha Pol Treating Provider/Extender: Yaakov Guthrie in Treatment: 0 Subjective Chief Complaint Information obtained from Patient Left lower extremity wound due to  trauma History of Present Illness (HPI) Admission 02/09/2021 Mr. Giulio Bertino is a 41 year old male otherwise healthy that presents to the clinic for an abrasion to the left leg caused by a cement wall on Halloween night. He originally took care of the wound and use Neosporin and kept the area clean however it has not healed. He has chronic pain to the area. He visited the ED on 01/18/2021 for this issue and they treated him with doxycycline and mupirocin ointment. He completed treatment and followed up with his family medicine office Who started him on ciprofloxacin. He also completed this course. He reports no improvement to the wound site after these courses of antibiotics. He denies ever having an x-ray of the area. 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 Patient History Information obtained from Patient. Allergies No Known Allergies Social History Former smoker - ended on 01/03/2020, Marital Status - Married, Alcohol Use - Moderate, Drug Use - No History, Caffeine Use - Moderate. Review of Systems (ROS) Constitutional Symptoms (General Health) Denies complaints or symptoms of Fatigue, Fever, Chills, Marked Weight Change. Eyes Denies complaints or symptoms of Dry Eyes, Vision Changes, Glasses / Contacts. Ear/Nose/Mouth/Throat Denies complaints or symptoms of Difficult clearing ears, Sinusitis. Hematologic/Lymphatic Denies complaints or symptoms of Bleeding / Clotting Disorders, Human Immunodeficiency Virus. Respiratory Denies complaints or symptoms of Chronic or frequent coughs, Shortness of Breath. Cardiovascular Denies complaints or symptoms of Chest pain, LE edema. Gastrointestinal Denies complaints or symptoms of Frequent diarrhea, Nausea, Vomiting. Endocrine Denies complaints or symptoms of Hepatitis, Thyroid disease, Polydypsia (Excessive Thirst). Genitourinary Denies complaints or symptoms of Kidney failure/ Dialysis,  Incontinence/dribbling. Immunological Denies complaints or symptoms of Hives, Itching. Integumentary (Skin) Denies complaints or symptoms of Wounds, Bleeding or bruising tendency, Breakdown, Swelling. Musculoskeletal Denies complaints or symptoms of Muscle Pain, Muscle Weakness. Neurologic Denies complaints or symptoms of Numbness/parasthesias, Focal/Weakness. Psychiatric Denies complaints or symptoms of Anxiety, Claustrophobia. Objective Paul Hanna, Paul Hanna ([id number]) Constitutional Vitals Time Taken: 10:31 AM, Height: 71 in, Source: Stated, Weight: 300 lbs, Source: Measured, BMI: 41.8, Temperature: 98.1 F, Pulse: 80 bpm, Respiratory Rate: 16 breaths/min, Blood Pressure: 141/95 mmHg. General Notes: Left lower extremity: To the anterior aspect there is an open wound with nonviable tissue throughout. No surrounding signs of infection. Integumentary (Hair, Skin) Wound #1 status is Open. Original cause of wound was Skin Tear/Laceration. The date acquired was: 11/01/2020. The wound is located on the Left Lower Leg. The wound measures 2.1cm length x 1.5cm width x 0.1cm depth; 2.474cm^2 area and 0.247cm^3 volume. There is Fat Layer (Subcutaneous Tissue) exposed. There is no tunneling or undermining noted. There is a medium amount of serosanguineous drainage noted. There is medium (34-66%)  red granulation within the wound bed. There is a medium (34-66%) amount of necrotic tissue within the wound bed. Assessment Active Problems ICD-10 Non-pressure chronic ulcer of other part of left lower leg with fat layer exposed Other early complications of trauma, initial encounter Patient presents with a 49-monthhistory of nonhealing ulcer to the left lower extremity caused by trauma. Patient is otherwise healthy. ABIs were 1.03 and had strong pedal pulses suggesting adequate blood flow. I think the main barrier to his wound healing is that the wound bed appears infected. I obtained a culture. I debrided  nonviable tissue. I recommended gentamicin ointment and Hydrofera Blue. He does not have significant swelling on exam and I Hanna not think he needs to be wrapped at this time. I did recommend compression stockings in general because he is on his feet most of the day. Follow-up in 1 week. 46 minutes was spent on the encounter including face-to-face, EMR review and coordination of care Procedures Wound #1 Pre-procedure diagnosis of Wound #1 is a Trauma, Other located on the Left Lower Leg . There was a Excisional Skin/Subcutaneous Tissue Debridement with a total area of 3.15 sq cm performed by HKalman Shan MD. With the following instrument(s): Curette to remove Viable and Non-Viable tissue/material. Material removed includes Subcutaneous Tissue and Slough and after achieving pain control using Lidocaine. 1 specimen was taken by a Swab and sent to the lab per facility protocol. A time out was conducted at 11:12, prior to the start of the procedure. A Minimum amount of bleeding was controlled with Pressure. The procedure was tolerated well. Post Debridement Measurements: 2.1cm length x 1.5cm width x 0.1cm depth; 0.247cm^3 volume. Character of Wound/Ulcer Post Debridement is improved. Post procedure Diagnosis Wound #1: Same as Pre-Procedure Plan Follow-up Appointments: Return Appointment in 1 week. Bathing/ Shower/ Hygiene: May shower; gently cleanse wound with antibacterial soap, rinse and pat dry prior to dressing wounds - change dressing after shower No tub bath. Anesthetic (Use 'Patient Medications' Section for Anesthetic Order Entry): Lidocaine applied to wound bed Edema Control - Lymphedema / Segmental Compressive Device / Other: Tubigrip single layer applied. - Size E applied Patient to wear own compression stockings. Remove compression stockings every night before going to bed and put on every morning when getting up. Elevate leg(s) parallel to the floor when sitting. Additional  Orders / Instructions: Follow Nutritious Diet and Increase Protein Intake Medications-Please add to medication list.: Take one 5063mTylenol (Acetaminophen) and one 20064motrin (Ibuprofen) every 6 hours for pain. Hanna not take ibuprofen if you are on blood [place], Werner ([id number] or have stomach ulcers. Topical Antibiotic - Pick up Gentamycin cream at pharmacy to apply for wound care as directed Laboratory ordered were: Wound culture routine - culture of left lower leg The following medication(s) was prescribed: gentamicin topical 0.1 % cream 1 1 application daily starting 02/09/2021 WOUND #1: - Lower Leg Wound Laterality: Left Cleanser: Byram Ancillary Kit - 15 Day Supply (DME) (Generic) 1 x Per Day/15 Days Discharge Instructions: Use supplies as instructed; Kit contains: (15) Saline Bullets; (15) 3x3 Gauze; 15 pr Gloves Cleanser: Normal Saline 1 x Per Day/15 Days Discharge Instructions: Wash your hands with soap and water. Remove old dressing, discard into plastic bag and place into trash. Cleanse the wound with Normal Saline prior to applying a clean dressing using gauze sponges, not tissues or cotton balls. Hanna not scrub or use excessive force. Pat dry using gauze sponges, not tissue or cotton balls. Cleanser: Soap and  Water 1 x Per Day/15 Days Discharge Instructions: Gently cleanse wound with antibacterial soap, rinse and pat dry prior to dressing wounds Cleanser: Wound Cleanser 1 x Per Day/15 Days Discharge Instructions: Wash your hands with soap and water. Remove old dressing, discard into plastic bag and place into trash. Cleanse the wound with Wound Cleanser prior to applying a clean dressing using gauze sponges, not tissues or cotton balls. Hanna not scrub or use excessive force. Pat dry using gauze sponges, not tissue or cotton balls. Primary Dressing: Hydrofera Blue Ready Transfer Foam, 4x5 (in/in) 1 x Per Day/15 Days Discharge Instructions: Apply Hydrofera Blue Ready to  wound bed as directed Secondary Dressing: Zetuvit Plus Silicone Border Dressing 5x5 (in/in) (DME) (Generic) 1 x Per Day/15 Days 1. In office sharp debridement 2. Gentamicin ointment and Hydrofera Blue 3. Compression stockings daily 4. Follow-up in 1 week Electronic Signature(s) Signed: 02/09/2021 11:55:16 AM By: Paul Hanna Entered By: Paul Shan on 02/09/2021 11:54:32 Paul Hanna ([id number]) -------------------------------------------------------------------------------- ROS/PFSH Details Patient Name: Paul Hanna Date of Service: 02/09/2021 10:00 AM Medical Record Number: [id number] Patient Account Number: [id number] Date of Birth/Sex: March 16, 1980 (41 y.o. M) Treating RN: Donnamarie Poag Primary Care Provider: Leretha Pol Other Clinician: Referring Provider: Leretha Pol Treating Provider/Extender: Yaakov Guthrie in Treatment: 0 Information Obtained From Patient Constitutional Symptoms (General Health) Complaints and Symptoms: Negative for: Fatigue; Fever; Chills; Marked Weight Change Eyes Complaints and Symptoms: Negative for: Dry Eyes; Vision Changes; Glasses / Contacts Ear/Nose/Mouth/Throat Complaints and Symptoms: Negative for: Difficult clearing ears; Sinusitis Hematologic/Lymphatic Complaints and Symptoms: Negative for: Bleeding / Clotting Disorders; Human Immunodeficiency Virus Respiratory Complaints and Symptoms: Negative for: Chronic or frequent coughs; Shortness of Breath Cardiovascular Complaints and Symptoms: Negative for: Chest pain; LE edema Gastrointestinal Complaints and Symptoms: Negative for: Frequent diarrhea; Nausea; Vomiting Endocrine Complaints and Symptoms: Negative for: Hepatitis; Thyroid disease; Polydypsia (Excessive Thirst) Genitourinary Complaints and Symptoms: Negative for: Kidney failure/ Dialysis; Incontinence/dribbling Immunological Complaints and Symptoms: Negative for: Hives; Itching Integumentary  (Skin) Complaints and Symptoms: Negative for: Wounds; Bleeding or bruising tendency; Breakdown; Swelling Paul Hanna, Paul ([id number]) Musculoskeletal Complaints and Symptoms: Negative for: Muscle Pain; Muscle Weakness Neurologic Complaints and Symptoms: Negative for: Numbness/parasthesias; Focal/Weakness Psychiatric Complaints and Symptoms: Negative for: Anxiety; Claustrophobia Oncologic Immunizations Pneumococcal Vaccine: Received Pneumococcal Vaccination: No Implantable Devices None Family and Social History Former smoker - ended on 01/03/2020; Marital Status - Married; Alcohol Use: Moderate; Drug Use: No History; Caffeine Use: Moderate Electronic Signature(s) Signed: 02/09/2021 11:55:16 AM By: Paul Hanna Signed: 02/09/2021 4:26:12 PM By: Donnamarie Poag Entered By: Donnamarie Poag on 02/09/2021 10:49:19 Paul Hanna ([id number]) -------------------------------------------------------------------------------- [facility] Details Patient Name: Paul Hanna Date of Service: 02/09/2021 Medical Record Number: [id number] Patient Account Number: [id number] Date of Birth/Sex: [birth year] (41 y.o. M) Treating RN: Donnamarie Poag Primary Care Provider: Leretha Pol Other Clinician: Referring Provider: Leretha Pol Treating Provider/Extender: Yaakov Guthrie in Treatment: 0 Diagnosis Coding ICD-10 Codes Code Description [phone] Non-pressure chronic ulcer of other part of left lower leg with fat layer exposed T79.8XXA Other early complications of trauma, initial encounter Facility Procedures CPT4 Code: [id number] Description: 99213 - WOUND CARE VISIT-LEV 3 EST PT Modifier: Quantity: 1 CPT4 Code: [id number] Description: 23557 - DEB SUBQ TISSUE 20 SQ CM/< Modifier: Quantity: 1 CPT4 Code: Description: ICD-10 Diagnosis Description L97.822 Non-pressure chronic ulcer of other part of left lower leg with fat layer Modifier: exposed Quantity: Physician Procedures CPT4 Code:  [id number] Description: 27062 - WC PHYS LEVEL 4 - NEW PT Modifier: Quantity: 1 CPT4 Code: Description: ICD-10  Diagnosis Description L97.822 Non-pressure chronic ulcer of other part of left lower leg with fat layer T79.8XXA Other early complications of trauma, initial encounter Modifier: exposed Quantity: CPT4 Code: [id number] Description: 11042 - WC PHYS SUBQ TISS 20 SQ CM Modifier: Quantity: 1 CPT4 Code: Description: ICD-10 Diagnosis Description L97.822 Non-pressure chronic ulcer of other part of left lower leg with fat layer Modifier: exposed Quantity: Electronic Signature(s) Signed: 02/09/2021 11:55:16 AM By: Paul Hanna Entered By: Paul Shan on 02/09/2021 11:54:46

## 2021-02-09 NOTE — Progress Notes (Signed)
AKING, KLABUNDE ([id number]) Visit Report for 02/09/2021 Allergy List Details Patient Name: Paul Hanna, Paul Hanna Date of Service: 02/09/2021 10:00 AM Medical Record Number: [id number] Patient Account Number: [id number] Date of Birth/Sex: 01/31/1980 (41 y.o. M) Treating RN: Donnamarie Poag Primary Care Mal Asher: Leretha Pol Other Clinician: Referring Laquesha Holcomb: Leretha Pol Treating Ivory Maduro/Extender: Yaakov Guthrie in Treatment: 0 Allergies Active Allergies No Known Allergies Allergy Notes Electronic Signature(s) Signed: 02/09/2021 4:26:12 PM By: Donnamarie Poag Entered By: Donnamarie Poag on 02/09/2021 10:44:05 Paul Hanna ([id number]) -------------------------------------------------------------------------------- Arrival Information Details Patient Name: Paul Hanna Date of Service: 02/09/2021 10:00 AM Medical Record Number: [id number] Patient Account Number: [id number] Date of Birth/Sex: 10/25/1980 (41 y.o. M) Treating RN: Donnamarie Poag Primary Care Judd Mccubbin: Leretha Pol Other Clinician: Referring Virgilio Broadhead: Leretha Pol Treating Merced Brougham/Extender: Yaakov Guthrie in Treatment: 0 Visit Information Patient Arrived: Ambulatory Arrival Time: 10:32 Accompanied By: wife Transfer Assistance: None Patient Identification Verified: Yes Secondary Verification Process Completed: Yes Patient Requires Transmission-Based Precautions: No Patient Has Alerts: Yes Patient Alerts: NOT Diabetic Electronic Signature(s) Signed: 02/09/2021 4:26:12 PM By: Donnamarie Poag Entered By: Donnamarie Poag on 02/09/2021 10:43:12 Paul Hanna ([id number]) -------------------------------------------------------------------------------- Clinic Level of Care Assessment Details Patient Name: Paul Hanna Date of Service: 02/09/2021 10:00 AM Medical Record Number: [id number] Patient Account Number: [id number] Date of Birth/Sex: 08/23/1980 (41 y.o. M) Treating RN: Donnamarie Poag Primary Care Dimple Bastyr: Leretha Pol Other  Clinician: Referring Keoni Havey: Leretha Pol Treating Kellan Boehlke/Extender: Yaakov Guthrie in Treatment: 0 Clinic Level of Care Assessment Items TOOL 1 Quantity Score []  - Use when EandM and Procedure is performed on INITIAL visit 0 ASSESSMENTS - Nursing Assessment / Reassessment X - General Physical Exam (combine w/ comprehensive assessment (listed just below) when performed on new 1 20 pt. evals) X- 1 25 Comprehensive Assessment (HX, ROS, Risk Assessments, Wounds Hx, etc.) ASSESSMENTS - Wound and Skin Assessment / Reassessment []  - Dermatologic / Skin Assessment (not related to wound area) 0 ASSESSMENTS - Ostomy and/or Continence Assessment and Care []  - Incontinence Assessment and Management 0 []  - 0 Ostomy Care Assessment and Management (repouching, etc.) PROCESS - Coordination of Care X - Simple Patient / Family Education for ongoing care 1 15 []  - 0 Complex (extensive) Patient / Family Education for ongoing care X- 1 10 Staff obtains Programmer, systems, Records, Test Results / Process Orders []  - 0 Staff telephones HHA, Nursing Homes / Clarify orders / etc []  - 0 Routine Transfer to another Facility (non-emergent condition) []  - 0 Routine Hospital Admission (non-emergent condition) X- 1 15 New Admissions / Biomedical engineer / Ordering NPWT, Apligraf, etc. []  - 0 Emergency Hospital Admission (emergent condition) PROCESS - Special Needs []  - Pediatric / Minor Patient Management 0 []  - 0 Isolation Patient Management []  - 0 Hearing / Language / Visual special needs []  - 0 Assessment of Community assistance (transportation, D/C planning, etc.) []  - 0 Additional assistance / Altered mentation []  - 0 Support Surface(s) Assessment (bed, cushion, seat, etc.) INTERVENTIONS - Miscellaneous []  - External ear exam 0 []  - 0 Patient Transfer (multiple staff / Civil Service fast streamer / Similar devices) []  - 0 Simple Staple / Suture removal (25 or less) []  - 0 Complex Staple /  Suture removal (26 or more) []  - 0 Hypo/Hyperglycemic Management (do not check if billed separately) X- 1 15 Ankle / Brachial Index (ABI) - do not check if billed separately Has the patient been seen at the hospital within the last three years: Yes Total Score: 100 Level Of Care: New/Established - Level 3 Penrod,  Tahjae ([id number]) Electronic Signature(s) Signed: 02/09/2021 4:26:12 PM By: Donnamarie Poag Entered By: Donnamarie Poag on 02/09/2021 11:36:20 Paul Hanna ([id number]) -------------------------------------------------------------------------------- Encounter Discharge Information Details Patient Name: Paul Hanna Date of Service: 02/09/2021 10:00 AM Medical Record Number: [id number] Patient Account Number: [id number] Date of Birth/Sex: Jun 17, 1980 (41 y.o. M) Treating RN: Donnamarie Poag Primary Care Xane Amsden: Leretha Pol Other Clinician: Referring Melitza Metheny: Leretha Pol Treating Trexton Escamilla/Extender: Yaakov Guthrie in Treatment: 0 Encounter Discharge Information Items Post Procedure Vitals Discharge Condition: Stable Temperature (F): 98.1 Ambulatory Status: Ambulatory Pulse (bpm): 80 Discharge Destination: Home Respiratory Rate (breaths/min): 16 Transportation: Private Auto Blood Pressure (mmHg): 141/95 Accompanied By: wife Schedule Follow-up Appointment: Yes Clinical Summary of Care: Electronic Signature(s) Signed: 02/09/2021 4:26:12 PM By: Donnamarie Poag Entered By: Donnamarie Poag on 02/09/2021 11:37:54 Paul Hanna ([id number]) -------------------------------------------------------------------------------- Lower Extremity Assessment Details Patient Name: Paul Hanna Date of Service: 02/09/2021 10:00 AM Medical Record Number: [id number] Patient Account Number: [id number] Date of Birth/Sex: 01-Mar-1980 (41 y.o. M) Treating RN: Donnamarie Poag Primary Care Glessie Eustice: Leretha Pol Other Clinician: Referring Melroy Bougher: Leretha Pol Treating Trent Theisen/Extender: Yaakov Guthrie in Treatment: 0 Edema Assessment Assessed: Shirlyn Goltz: Yes] [Right: No] Edema: [Left: Ye] [Right: s] Calf Left: Right: Point of Measurement: 31 cm From Medial Instep 46.5 cm Ankle Left: Right: Point of Measurement: 10 cm From Medial Instep 29 cm Knee To Floor Left: Right: From Medial Instep 42 cm Vascular Assessment Pulses: Dorsalis Pedis Palpable: [Left:Yes] Blood Pressure: Brachial: [Left:126] Ankle: [Left:Dorsalis Pedis: 130 1.03] Electronic Signature(s) Signed: 02/09/2021 4:26:12 PM By: Donnamarie Poag Entered By: Donnamarie Poag on 02/09/2021 11:00:13 Paul Hanna ([id number]) -------------------------------------------------------------------------------- Multi Wound Chart Details Patient Name: Paul Hanna Date of Service: 02/09/2021 10:00 AM Medical Record Number: [id number] Patient Account Number: [id number] Date of Birth/Sex: 1980-10-06 (41 y.o. M) Treating RN: Donnamarie Poag Primary Care Aramis Weil: Leretha Pol Other Clinician: Referring Sylvester Salonga: Leretha Pol Treating Trevonne Nyland/Extender: Yaakov Guthrie in Treatment: 0 Vital Signs Height(in): 71 Pulse(bpm): 80 Weight(lbs): 300 Blood Pressure(mmHg): 141/95 Body Mass Index(BMI): 41.8 Temperature(F): 98.1 Respiratory Rate(breaths/min): 16 Photos: [N/A:N/A] Wound Location: Left Lower Leg N/A N/A Wounding Event: Skin Tear/Laceration N/A N/A Primary Etiology: Trauma, Other N/A N/A Date Acquired: 11/01/2020 N/A N/A Weeks of Treatment: 0 N/A N/A Wound Status: Open N/A N/A Wound Recurrence: No N/A N/A Measurements L x W x D (cm) 2.1x1.5x0.1 N/A N/A Area (cm) : 2.474 N/A N/A Volume (cm) : 0.247 N/A N/A Classification: Full Thickness Without Exposed N/A N/A Support Structures Exudate Amount: Medium N/A N/A Exudate Type: Serosanguineous N/A N/A Exudate Color: red, brown N/A N/A Granulation Amount: Medium (34-66%) N/A N/A Granulation Quality: Red N/A N/A Necrotic Amount: Medium (34-66%) N/A  N/A Exposed Structures: Fat Layer (Subcutaneous Tissue): N/A N/A Yes Fascia: No Tendon: No Muscle: No Joint: No Bone: No Debridement: Debridement - Excisional N/A N/A Pre-procedure Verification/Time 11:12 N/A N/A Out Taken: Pain Control: Lidocaine N/A N/A Tissue Debrided: Subcutaneous, Slough N/A N/A Level: Skin/Subcutaneous Tissue N/A N/A Debridement Area (sq cm): 3.15 N/A N/A Instrument: Curette N/A N/A Specimen: Swab N/A N/A Number of Specimens Taken: 1 N/A N/A Bleeding: Minimum N/A N/A Hemostasis Achieved: Pressure N/A N/A Debridement Treatment Procedure was tolerated well N/A N/A Response: Post Debridement 2.1x1.5x0.1 N/A N/A Measurements L x W x D (cm) Post Debridement Volume: 0.247 N/A N/A (cm) Ferber, Crews ([id number]) Procedures Performed: Debridement N/A N/A Treatment Notes Wound #1 (Lower Leg) Wound Laterality: Left Cleanser Byram Ancillary Kit - 15 Day Supply Discharge Instruction: Use supplies as instructed; Kit contains: (15) Saline Bullets; (15) 3x3 Gauze;  15 pr Gloves Normal Saline Discharge Instruction: Wash your hands with soap and water. Remove old dressing, discard into plastic bag and place into trash. Cleanse the wound with Normal Saline prior to applying a clean dressing using gauze sponges, not tissues or cotton balls. Do not scrub or use excessive force. Pat dry using gauze sponges, not tissue or cotton balls. Soap and Water Discharge Instruction: Gently cleanse wound with antibacterial soap, rinse and pat dry prior to dressing wounds Wound Cleanser Discharge Instruction: Wash your hands with soap and water. Remove old dressing, discard into plastic bag and place into trash. Cleanse the wound with Wound Cleanser prior to applying a clean dressing using gauze sponges, not tissues or cotton balls. Do not scrub or use excessive force. Pat dry using gauze sponges, not tissue or cotton balls. Peri-Wound Care Topical Primary Dressing Hydrofera Blue  Ready Transfer Foam, 4x5 (in/in) Discharge Instruction: Apply Hydrofera Blue Ready to wound bed as directed Secondary Dressing Zetuvit Plus Silicone Border Dressing 5x5 (in/in) Secured With Compression Wrap Compression Stockings Add-Ons Electronic Signature(s) Signed: 02/09/2021 11:55:16 AM By: Kalman Shan DO Entered By: Kalman Shan on 02/09/2021 11:45:54 Paul Hanna ([id number]) -------------------------------------------------------------------------------- Multi-Disciplinary Care Plan Details Patient Name: Paul Hanna Date of Service: 02/09/2021 10:00 AM Medical Record Number: [id number] Patient Account Number: [id number] Date of Birth/Sex: 04/02/1980 (41 y.o. M) Treating RN: Donnamarie Poag Primary Care Kera Deacon: Leretha Pol Other Clinician: Referring Jaivyn Gulla: Leretha Pol Treating Jarrid Lienhard/Extender: Yaakov Guthrie in Treatment: 0 Active Inactive Orientation to the Wound Care Program Nursing Diagnoses: Knowledge deficit related to the wound healing center program Goals: Patient/caregiver will verbalize understanding of the [facility] Program Date Initiated: 02/09/2021 Target Resolution Date: 02/18/2021 Goal Status: Active Interventions: Provide education on orientation to the wound center Notes: Wound/Skin Impairment Nursing Diagnoses: Impaired tissue integrity Knowledge deficit related to smoking impact on wound healing Knowledge deficit related to ulceration/compromised skin integrity Goals: Patient/caregiver will verbalize understanding of skin care regimen Date Initiated: 02/09/2021 Target Resolution Date: 02/25/2021 Goal Status: Active Ulcer/skin breakdown will have a volume reduction of 30% by week 4 Date Initiated: 02/09/2021 Target Resolution Date: 03/09/2021 Goal Status: Active Ulcer/skin breakdown will have a volume reduction of 50% by week 8 Date Initiated: 02/09/2021 Target Resolution Date: 04/06/2021 Goal Status: Active Ulcer/skin  breakdown will have a volume reduction of 80% by week 12 Date Initiated: 02/09/2021 Target Resolution Date: 05/04/2021 Goal Status: Active Ulcer/skin breakdown will heal within 14 weeks Date Initiated: 02/09/2021 Target Resolution Date: 05/18/2021 Goal Status: Active Interventions: Assess patient/caregiver ability to obtain necessary supplies Assess patient/caregiver ability to perform ulcer/skin care regimen upon admission and as needed Assess ulceration(s) every visit Notes: Electronic Signature(s) Signed: 02/09/2021 4:26:12 PM By: Donnamarie Poag Entered By: Donnamarie Poag on 02/09/2021 11:08:30 Paul Hanna ([id number]) -------------------------------------------------------------------------------- Pain Assessment Details Patient Name: Paul Hanna Date of Service: 02/09/2021 10:00 AM Medical Record Number: [id number] Patient Account Number: [id number] Date of Birth/Sex: [birth year] (41 y.o. M) Treating RN: Donnamarie Poag Primary Care Margerite Impastato: Leretha Pol Other Clinician: Referring Sonali Wivell: Leretha Pol Treating Omer Puccinelli/Extender: Yaakov Guthrie in Treatment: 0 Active Problems Location of Pain Severity and Description of Pain Patient Has Paino Yes Site Locations Pain Location: Pain in Ulcers Rate the pain. Current Pain Level: 6 Pain Management and Medication Current Pain Management: Electronic Signature(s) Signed: 02/09/2021 4:26:12 PM By: Donnamarie Poag Entered By: Donnamarie Poag on 02/09/2021 10:43:24 Paul Hanna ([id number]) -------------------------------------------------------------------------------- Patient/Caregiver Education Details Patient Name: Paul Hanna Date of Service: 02/09/2021 10:00 AM Medical Record Number: [id number] Patient Account  Number: [id number] Date of Birth/Gender: 10-12-1980 (41 y.o. M) Treating RN: Donnamarie Poag Primary Care Physician: Leretha Pol Other Clinician: Referring Physician: Leretha Pol Treating Physician/Extender: Yaakov Guthrie in Treatment: 0 Education Assessment Education Provided To: Patient Education Topics Provided Basic Hygiene: Infection: Nutrition: Venous: Welcome [facility] [place]: Wound Debridement: Wound/Skin Impairment: Electronic Signature(s) Signed: 02/09/2021 4:26:12 PM By: Donnamarie Poag Entered By: Donnamarie Poag on 02/09/2021 11:36:54 Paul Hanna ([id number]) -------------------------------------------------------------------------------- Wound Assessment Details Patient Name: Paul Hanna Date of Service: 02/09/2021 10:00 AM Medical Record Number: [id number] Patient Account Number: [id number] Date of Birth/Sex: Apr 29, 1980 (41 y.o. M) Treating RN: Donnamarie Poag Primary Care Rexanne Inocencio: Leretha Pol Other Clinician: Referring Mel Tadros: Leretha Pol Treating Diarra Kos/Extender: Yaakov Guthrie in Treatment: 0 Wound Status Wound Number: 1 Primary Etiology: Trauma, Other Wound Location: Left Lower Leg Wound Status: Open Wounding Event: Skin Tear/Laceration Date Acquired: 11/01/2020 Weeks Of Treatment: 0 Clustered Wound: No Photos Wound Measurements Length: (cm) 2.1 Width: (cm) 1.5 Depth: (cm) 0.1 Area: (cm) 2.474 Volume: (cm) 0.247 % Reduction in Area: % Reduction in Volume: Tunneling: No Undermining: No Wound Description Classification: Full Thickness Without Exposed Support Structures Exudate Amount: Medium Exudate Type: Serosanguineous Exudate Color: red, brown Foul Odor After Cleansing: No Slough/Fibrino Yes Wound Bed Granulation Amount: Medium (34-66%) Exposed Structure Granulation Quality: Red Fascia Exposed: No Necrotic Amount: Medium (34-66%) Fat Layer (Subcutaneous Tissue) Exposed: Yes Tendon Exposed: No Muscle Exposed: No Joint Exposed: No Bone Exposed: No Treatment Notes Wound #1 (Lower Leg) Wound Laterality: Left Cleanser Byram Ancillary Kit - 15 Day Supply Discharge Instruction: Use supplies as instructed; Kit contains: (15)  Saline Bullets; (15) 3x3 Gauze; 15 pr Gloves Normal Saline Discharge Instruction: Wash your hands with soap and water. Remove old dressing, discard into plastic bag and place into trash. Cleanse the wound with Normal Saline prior to applying a clean dressing using gauze sponges, not tissues or cotton balls. Do not Davisson, Jennifer ([id number]) scrub or use excessive force. Pat dry using gauze sponges, not tissue or cotton balls. Soap and Water Discharge Instruction: Gently cleanse wound with antibacterial soap, rinse and pat dry prior to dressing wounds Wound Cleanser Discharge Instruction: Wash your hands with soap and water. Remove old dressing, discard into plastic bag and place into trash. Cleanse the wound with Wound Cleanser prior to applying a clean dressing using gauze sponges, not tissues or cotton balls. Do not scrub or use excessive force. Pat dry using gauze sponges, not tissue or cotton balls. Peri-Wound Care Topical Primary Dressing Hydrofera Blue Ready Transfer Foam, 4x5 (in/in) Discharge Instruction: Apply Hydrofera Blue Ready to wound bed as directed Secondary Dressing Zetuvit Plus Silicone Border Dressing 5x5 (in/in) Secured With Compression Wrap Compression Stockings Add-Ons Electronic Signature(s) Signed: 02/09/2021 4:26:12 PM By: Donnamarie Poag Entered By: Donnamarie Poag on 02/09/2021 10:55:26 Paul Hanna ([id number]) -------------------------------------------------------------------------------- [place] Details Patient Name: Paul Hanna Date of Service: 02/09/2021 10:00 AM Medical Record Number: [id number] Patient Account Number: [id number] Date of Birth/Sex: 06-Mar-1980 (41 y.o. M) Treating RN: Donnamarie Poag Primary Care Dereona Kolodny: Leretha Pol Other Clinician: Referring Cambell Stanek: Leretha Pol Treating Ezekiel Menzer/Extender: Yaakov Guthrie in Treatment: 0 Vital Signs Time Taken: 10:31 Temperature (F): 98.1 Height (in): 71 Pulse (bpm): 80 Source:  Stated Respiratory Rate (breaths/min): 16 Weight (lbs): 300 Blood Pressure (mmHg): 141/95 Source: Measured Reference Range: 80 - 120 mg / dl Body Mass Index (BMI): 41.8 Electronic Signature(s) Signed: 02/09/2021 4:26:12 PM By: Donnamarie Poag Entered ByDonnamarie Poag on 02/09/2021 10:43:52

## 2021-02-09 NOTE — Progress Notes (Signed)
Paul Hanna, Paul Hanna ([id number]) Visit Report for 02/09/2021 Abuse Risk Screen Details Patient Name: Paul Hanna, Paul Hanna Date of Service: 02/09/2021 10:00 AM Medical Record Number: [id number] Patient Account Number: [id number] Date of Birth/Sex: April 15, 1980 (40 y.o. M) Treating RN: Hansel Feinstein Primary Care Matthan Sledge: Vincent Gros Other Clinician: Referring Zandyr Barnhill: Vincent Gros Treating Hadlee Burback/Extender: Tilda Franco in Treatment: 0 Abuse Risk Screen Items Answer ABUSE RISK SCREEN: Has anyone close to you tried to hurt or harm you recentlyo No Do you feel uncomfortable with anyone in your familyo No Has anyone forced you do things that you didnot want to doo No Electronic Signature(s) Signed: 02/09/2021 4:26:12 PM By: Hansel Feinstein Entered By: Hansel Feinstein on 02/09/2021 10:46:33 Paul Hanna ([id number]) -------------------------------------------------------------------------------- Activities of Daily Living Details Patient Name: Paul Hanna Date of Service: 02/09/2021 10:00 AM Medical Record Number: [id number] Patient Account Number: [id number] Date of Birth/Sex: 04/30/1980 (40 y.o. M) Treating RN: Hansel Feinstein Primary Care Taneah Masri: Vincent Gros Other Clinician: Referring Marnae Madani: Vincent Gros Treating Holly Pring/Extender: Tilda Franco in Treatment: 0 Activities of Daily Living Items Answer Activities of Daily Living (Please select one for each item) Drive Automobile Completely Able Take Medications Completely Able Use Telephone Completely Able Care for Appearance Completely Able Use Toilet Completely Able Bath / Shower Completely Able Dress Self Completely Able Feed Self Completely Able Walk Completely Able Get In / Out Bed Completely Able Housework Completely Able Prepare Meals Completely Able Handle Money Completely Able Shop for Self Completely Able Electronic Signature(s) Signed: 02/09/2021 4:26:12 PM By: Hansel Feinstein Entered By: Hansel Feinstein on 02/09/2021  10:44:44 Paul Hanna ([id number]) -------------------------------------------------------------------------------- Education Screening Details Patient Name: Paul Hanna Date of Service: 02/09/2021 10:00 AM Medical Record Number: [id number] Patient Account Number: [id number] Date of Birth/Sex: Jan 19, 1980 (40 y.o. M) Treating RN: Hansel Feinstein Primary Care Laurisa Sahakian: Vincent Gros Other Clinician: Referring Argel Pablo: Vincent Gros Treating Tigran Haynie/Extender: Tilda Franco in Treatment: 0 Primary Learner Assessed: Patient Learning Preferences/Education Level/Primary Language Learning Preference: Explanation Highest Education Level: High School Preferred Language: English Cognitive Barrier Language Barrier: No Translator Needed: No Memory Deficit: No Emotional Barrier: No Cultural/Religious Beliefs Affecting Medical Care: No Physical Barrier Impaired Vision: No Impaired Hearing: No Decreased Hand dexterity: No Knowledge/Comprehension Knowledge Level: High Comprehension Level: High Ability to understand written instructions: High Ability to understand verbal instructions: High Motivation Anxiety Level: Calm Cooperation: Cooperative Education Importance: Acknowledges Need Interest in Health Problems: Asks Questions Perception: Coherent Willingness to Engage in Self-Management High Activities: Readiness to Engage in Self-Management High Activities: Electronic Signature(s) Signed: 02/09/2021 4:26:12 PM By: Hansel Feinstein Entered ByHansel Feinstein on 02/09/2021 10:46:19 Paul Hanna ([id number]) -------------------------------------------------------------------------------- Fall Risk Assessment Details Patient Name: Paul Hanna Date of Service: 02/09/2021 10:00 AM Medical Record Number: [id number] Patient Account Number: [id number] Date of Birth/Sex: [birth year] (40 y.o. M) Treating RN: Hansel Feinstein Primary Care Ceili Boshers: Vincent Gros Other Clinician: Referring  Channell Quattrone: Vincent Gros Treating Charelle Petrakis/Extender: Tilda Franco in Treatment: 0 Fall Risk Assessment Items Have you had 2 or more falls in the last 12 monthso 0 No Have you had any fall that resulted in injury in the last 12 monthso 0 Yes FALLS RISK SCREEN History of falling - immediate or within 3 months 25 Yes Secondary diagnosis (Do you have 2 or more medical diagnoseso) 0 No Ambulatory aid None/bed rest/wheelchair/nurse 0 Yes Crutches/cane/walker 0 No Furniture 0 No Intravenous therapy Access/Saline/Heparin Lock 0 No Gait/Transferring Normal/ bed rest/ wheelchair 0 Yes Weak (short steps with or without shuffle, stooped but able to lift head while walking, may  0 No seek support from furniture) Impaired (short steps with shuffle, may have difficulty arising from chair, head down, impaired 0 No balance) Mental Status Oriented to own ability 0 Yes Electronic Signature(s) Signed: 02/09/2021 4:26:12 PM By: Hansel Feinstein Entered By: Hansel Feinstein on 02/09/2021 10:45:37 Paul Hanna ([id number]) -------------------------------------------------------------------------------- Foot Assessment Details Patient Name: Paul Hanna Date of Service: 02/09/2021 10:00 AM Medical Record Number: [id number] Patient Account Number: [id number] Date of Birth/Sex: Nov 13, 1980 (40 y.o. M) Treating RN: Hansel Feinstein Primary Care Gioia Ranes: Vincent Gros Other Clinician: Referring Jerold Yoss: Vincent Gros Treating Briar Witherspoon/Extender: Tilda Franco in Treatment: 0 Foot Assessment Items Site Locations + = Sensation present, - = Sensation absent, C = Callus, U = Ulcer R = Redness, W = Warmth, M = Maceration, PU = Pre-ulcerative lesion F = Fissure, S = Swelling, D = Dryness Assessment Right: Left: Other Deformity: No No Prior Foot Ulcer: No No Prior Amputation: No No Charcot Joint: No No Ambulatory Status: Ambulatory Without Help Gait: Steady Electronic Signature(s) Signed:  02/09/2021 4:26:12 PM By: Hansel Feinstein Entered By: Hansel Feinstein on 02/09/2021 10:51:07 Paul Hanna ([id number]) -------------------------------------------------------------------------------- Nutrition Risk Screening Details Patient Name: Paul Hanna Date of Service: 02/09/2021 10:00 AM Medical Record Number: [id number] Patient Account Number: [id number] Date of Birth/Sex: October 21, 1980 (40 y.o. M) Treating RN: Hansel Feinstein Primary Care Shayla Heming: Vincent Gros Other Clinician: Referring Christina Gintz: Vincent Gros Treating Euan Wandler/Extender: Tilda Franco in Treatment: 0 Height (in): 71 Weight (lbs): 300 Body Mass Index (BMI): 41.8 Nutrition Risk Screening Items Score Screening NUTRITION RISK SCREEN: I have an illness or condition that made me change the kind and/or amount of food I eat 0 No I eat fewer than two meals per day 0 No I eat few fruits and vegetables, or milk products 0 No I have three or more drinks of beer, liquor or wine almost every day 0 No I have tooth or mouth problems that make it hard for me to eat 0 No I don't always have enough money to buy the food I need 0 No I eat alone most of the time 0 No I take three or more different prescribed or over-the-counter drugs a day 0 No Without wanting to, I have lost or gained 10 pounds in the last six months 0 No I am not always physically able to shop, cook and/or feed myself 0 No Nutrition Protocols Good Risk Protocol 0 No interventions needed Moderate Risk Protocol High Risk Proctocol Risk Level: Good Risk Score: 0 Electronic Signature(s) Signed: 02/09/2021 4:26:12 PM By: Hansel Feinstein Entered ByHansel Feinstein on 02/09/2021 10:45:05

## 2021-02-12 LAB — AEROBIC CULTURE W GRAM STAIN (SUPERFICIAL SPECIMEN)
Culture: NO GROWTH
Gram Stain: NONE SEEN

## 2021-02-16 ENCOUNTER — Encounter (HOSPITAL_BASED_OUTPATIENT_CLINIC_OR_DEPARTMENT_OTHER): Payer: BC Managed Care – PPO | Admitting: Internal Medicine

## 2021-02-16 ENCOUNTER — Other Ambulatory Visit: Payer: Self-pay

## 2021-02-16 DIAGNOSIS — L97822 Non-pressure chronic ulcer of other part of left lower leg with fat layer exposed: Secondary | ICD-10-CM

## 2021-02-16 DIAGNOSIS — I87312 Chronic venous hypertension (idiopathic) with ulcer of left lower extremity: Secondary | ICD-10-CM | POA: Diagnosis not present

## 2021-02-17 NOTE — Progress Notes (Signed)
SPENCER, CARDINAL ([id number]) Visit Report for 02/16/2021 Arrival Information Details Patient Name: Paul Hanna, PLAIN Date of Service: 02/16/2021 8:45 AM Medical Record Number: [id number] Patient Account Number: [id number] Date of Birth/Sex: 05-Jun-1980 (41 y.o. M) Treating RN: Carlene Coria Primary Care Ramar Nobrega: Leretha Pol Other Clinician: Referring Erynne Kealey: Leretha Pol Treating Takari Lundahl/Extender: Yaakov Guthrie in Treatment: 1 Visit Information History Since Last Visit All ordered tests and consults were completed: No Patient Arrived: Ambulatory Added or deleted any medications: No Arrival Time: 09:00 Any new allergies or adverse reactions: No Accompanied By: self Had a fall or experienced change in No Transfer Assistance: None activities of daily living that may affect Patient Identification Verified: Yes risk of falls: Secondary Verification Process Completed: Yes Signs or symptoms of abuse/neglect since last visito No Patient Requires Transmission-Based Precautions: No Hospitalized since last visit: No Patient Has Alerts: Yes Implantable device outside of the clinic excluding No Patient Alerts: NOT Diabetic cellular tissue based products placed in the center since last visit: Has Dressing in Place as Prescribed: Yes Pain Present Now: No Notes patient denies lightheadness, dizziness, ring of ears, blurred vision. alert and oriented times 3 , notified Dr Heber Smyer of blood pressure Electronic Signature(s) Signed: 02/17/2021 9:01:13 AM By: Carlene Coria RN Entered By: Carlene Coria on 02/16/2021 09:41:16 Paul Hanna ([id number]) -------------------------------------------------------------------------------- Clinic Level of Care Assessment Details Patient Name: Paul Hanna Date of Service: 02/16/2021 8:45 AM Medical Record Number: [id number] Patient Account Number: [id number] Date of Birth/Sex: 1980/08/14 (41 y.o. M) Treating RN: Carlene Coria Primary Care Mordechai Matuszak:  Leretha Pol Other Clinician: Referring Colleen Kotlarz: Leretha Pol Treating Rony Ratz/Extender: Yaakov Guthrie in Treatment: 1 Clinic Level of Care Assessment Items TOOL 1 Quantity Score _0  - Use when EandM and Procedure is performed on INITIAL visit 0 ASSESSMENTS - Nursing Assessment / Reassessment _1  - General Physical Exam (combine w/ comprehensive assessment (listed just below) when performed on new 0 pt. evals) _2  - 0 Comprehensive Assessment (HX, ROS, Risk Assessments, Wounds Hx, etc.) ASSESSMENTS - Wound and Skin Assessment / Reassessment _3  - Dermatologic / Skin Assessment (not related to wound area) 0 ASSESSMENTS - Ostomy and/or Continence Assessment and Care _4  - Incontinence Assessment and Management 0 _5  - 0 Ostomy Care Assessment and Management (repouching, etc.) PROCESS - Coordination of Care _6  - Simple Patient / Family Education for ongoing care 0 _7  - 0 Complex (extensive) Patient / Family Education for ongoing care _8  - 0 Staff obtains Programmer, systems, Records, Test Results / Process Orders _9  - 0 Staff telephones HHA, Nursing Homes / Clarify orders / etc _10  - 0 Routine Transfer to another Facility (non-emergent condition) _11  - 0 Routine Hospital Admission (non-emergent condition) _12  - 0 New Admissions / Biomedical engineer / Ordering NPWT, Apligraf, etc. _13  - 0 Emergency Hospital Admission (emergent condition) PROCESS - Special Needs _14  - Pediatric / Minor Patient Management 0 _15  - 0 Isolation Patient Management _16  - 0 Hearing / Language / Visual special needs _17  - 0 Assessment of Community assistance (transportation, D/C planning, etc.) _18  - 0 Additional assistance / Altered mentation _19  - 0 Support Surface(s) Assessment (bed, cushion, seat, etc.) INTERVENTIONS - Miscellaneous _20  - External ear exam 0 _21  - 0 Patient Transfer (multiple staff / Civil Service fast streamer / Similar devices) _22  - 0 Simple Staple / Suture removal (25 or less) _23  - 0 Complex  Staple / Suture removal (26 or more) _24  - 0 Hypo/Hyperglycemic Management (do not check if billed separately) _25  - 0 Ankle / Brachial Index (ABI) - do not  check if billed separately Has the patient been seen at the hospital within the last three years: Yes Total Score: 0 Level Of Care: ____ Paul Hanna ([id number]) Electronic Signature(s) Signed: 02/17/2021 9:01:13 AM By: Carlene Coria RN Entered By: Carlene Coria on 02/16/2021 09:38:33 Paul Hanna ([id number]) -------------------------------------------------------------------------------- Compression Therapy Details Patient Name: Paul Hanna Date of Service: 02/16/2021 8:45 AM Medical Record Number: [id number] Patient Account Number: [id number] Date of Birth/Sex: 1980-04-28 (41 y.o. M) Treating RN: Carlene Coria Primary Care Janautica Netzley: Leretha Pol Other Clinician: Referring Madelaine Whipple: Leretha Pol Treating Kember Boch/Extender: Yaakov Guthrie in Treatment: 1 Compression Therapy Performed for Wound Assessment: Wound #1 Left Lower Leg Performed By: Clinician Carlene Coria, RN Compression Type: Three Layer Post Procedure Diagnosis Same as Pre-procedure Electronic Signature(s) Signed: 02/17/2021 9:01:13 AM By: Carlene Coria RN Entered By: Carlene Coria on 02/16/2021 09:41:42 Paul Hanna ([id number]) -------------------------------------------------------------------------------- Encounter Discharge Information Details Patient Name: Paul Hanna Date of Service: 02/16/2021 8:45 AM Medical Record Number: [id number] Patient Account Number: [id number] Date of Birth/Sex: Oct 07, 1980 (41 y.o. M) Treating RN: Carlene Coria Primary Care Leith Hedlund: Leretha Pol Other Clinician: Referring Erika Slaby: Leretha Pol Treating Ashaz Robling/Extender: Yaakov Guthrie in Treatment: 1 Encounter Discharge Information Items Post Procedure Vitals Discharge Condition: Stable Temperature (F): 98.4 Ambulatory Status: Ambulatory Pulse (bpm):  80 Discharge Destination: Home Respiratory Rate (breaths/min): 18 Transportation: Private Auto Blood Pressure (mmHg): 172/112 Accompanied By: self Schedule Follow-up Appointment: Yes Clinical Summary of Care: Patient Declined Electronic Signature(s) Signed: 02/17/2021 9:01:13 AM By: Carlene Coria RN Entered By: Carlene Coria on 02/16/2021 09:40:04 Paul Hanna ([id number]) -------------------------------------------------------------------------------- Lower Extremity Assessment Details Patient Name: Paul Hanna Date of Service: 02/16/2021 8:45 AM Medical Record Number: [id number] Patient Account Number: [id number] Date of Birth/Sex: Aug 21, 1980 (41 y.o. M) Treating RN: Carlene Coria Primary Care Kadan Millstein: Leretha Pol Other Clinician: Referring Caeleigh Prohaska: Leretha Pol Treating Ilithyia Titzer/Extender: Yaakov Guthrie in Treatment: 1 Edema Assessment Assessed: [Left: No] [Right: No] Edema: [Left: Ye] [Right: s] Calf Left: Right: Point of Measurement: 31 cm From Medial Instep 47 cm Ankle Left: Right: Point of Measurement: 10 cm From Medial Instep 28 cm Vascular Assessment Pulses: Dorsalis Pedis Palpable: [Left:Yes] Electronic Signature(s) Signed: 02/17/2021 9:01:13 AM By: Carlene Coria RN Entered By: Carlene Coria on 02/16/2021 09:10:23 Paul Hanna ([id number]) -------------------------------------------------------------------------------- Multi Wound Chart Details Patient Name: Paul Hanna Date of Service: 02/16/2021 8:45 AM Medical Record Number: [id number] Patient Account Number: [id number] Date of Birth/Sex: 19-Sep-1980 (41 y.o. M) Treating RN: Carlene Coria Primary Care Terrian Ridlon: Leretha Pol Other Clinician: Referring Semaya Vida: Leretha Pol Treating Justun Anaya/Extender: Yaakov Guthrie in Treatment: 1 Vital Signs Height(in): 71 Pulse(bpm): 43 Weight(lbs): 300 Blood Pressure(mmHg): 172/112 Body Mass Index(BMI): 41.8 Temperature(F): 98.4 Respiratory  Rate(breaths/min): 18 Photos: [N/A:N/A] Wound Location: Left Lower Leg N/A N/A Wounding Event: Skin Tear/Laceration N/A N/A Primary Etiology: Trauma, Other N/A N/A Date Acquired: 11/01/2020 N/A N/A Weeks of Treatment: 1 N/A N/A Wound Status: Open N/A N/A Wound Recurrence: No N/A N/A Measurements L x W x D (cm) 2.1x1.5x0.1 N/A N/A Area (cm) : 2.474 N/A N/A Volume (cm) : 0.247 N/A N/A % Reduction in Area: 0.00% N/A N/A % Reduction in Volume: 0.00% N/A N/A Classification: Full Thickness Without Exposed N/A N/A Support Structures Exudate Amount: Medium N/A N/A Exudate Type: Serosanguineous N/A N/A Exudate Color: red, brown N/A N/A Granulation Amount: Medium (34-66%) N/A N/A Granulation Quality: Red N/A N/A Necrotic Amount: Medium (34-66%) N/A N/A Exposed Structures: Fat Layer (Subcutaneous Tissue): N/A N/A Yes Fascia: No Tendon: No Muscle: No Joint: No Bone: No Epithelialization: None  N/A N/A Treatment Notes Electronic Signature(s) Signed: 02/17/2021 9:01:13 AM By: Carlene Coria RN Entered By: Carlene Coria on 02/16/2021 09:18:36 Paul Hanna ([id number]) -------------------------------------------------------------------------------- Multi-Disciplinary Care Plan Details Patient Name: Paul Hanna Date of Service: 02/16/2021 8:45 AM Medical Record Number: [id number] Patient Account Number: [id number] Date of Birth/Sex: 04/26/1980 (41 y.o. M) Treating RN: Carlene Coria Primary Care Ha Shannahan: Leretha Pol Other Clinician: Referring Sabriah Hobbins: Leretha Pol Treating Carder Yin/Extender: Yaakov Guthrie in Treatment: 1 Active Inactive Orientation to the Wound Care Program Nursing Diagnoses: Knowledge deficit related to the wound healing center program Goals: Patient/caregiver will verbalize understanding of the [facility] Date Initiated: 02/09/2021 Target Resolution Date: 02/18/2021 Goal Status: Active Interventions: Provide education on orientation  to the wound center Notes: Wound/Skin Impairment Nursing Diagnoses: Impaired tissue integrity Knowledge deficit related to smoking impact on wound healing Knowledge deficit related to ulceration/compromised skin integrity Goals: Patient/caregiver will verbalize understanding of skin care regimen Date Initiated: 02/09/2021 Target Resolution Date: 02/25/2021 Goal Status: Active Ulcer/skin breakdown will have a volume reduction of 30% by week 4 Date Initiated: 02/09/2021 Target Resolution Date: 03/09/2021 Goal Status: Active Ulcer/skin breakdown will have a volume reduction of 50% by week 8 Date Initiated: 02/09/2021 Target Resolution Date: 04/06/2021 Goal Status: Active Ulcer/skin breakdown will have a volume reduction of 80% by week 12 Date Initiated: 02/09/2021 Target Resolution Date: 05/04/2021 Goal Status: Active Ulcer/skin breakdown will heal within 14 weeks Date Initiated: 02/09/2021 Target Resolution Date: 05/18/2021 Goal Status: Active Interventions: Assess patient/caregiver ability to obtain necessary supplies Assess patient/caregiver ability to perform ulcer/skin care regimen upon admission and as needed Assess ulceration(s) every visit Notes: Electronic Signature(s) Signed: 02/17/2021 9:01:13 AM By: Carlene Coria RN Entered By: Carlene Coria on 02/16/2021 09:36:55 Paul Hanna ([id number]) -------------------------------------------------------------------------------- Pain Assessment Details Patient Name: Paul Hanna Date of Service: 02/16/2021 8:45 AM Medical Record Number: [id number] Patient Account Number: [id number] Date of Birth/Sex: 1980-04-18 (41 y.o. M) Treating RN: Carlene Coria Primary Care Fabio Wah: Leretha Pol Other Clinician: Referring Maliah Pyles: Leretha Pol Treating Akiva Josey/Extender: Yaakov Guthrie in Treatment: 1 Active Problems Location of Pain Severity and Description of Pain Patient Has Paino No Site Locations Pain Management and  Medication Current Pain Management: Electronic Signature(s) Signed: 02/17/2021 9:01:13 AM By: Carlene Coria RN Entered By: Carlene Coria on 02/16/2021 09:04:13 Paul Hanna ([id number]) -------------------------------------------------------------------------------- Patient/Caregiver Education Details Patient Name: Paul Hanna Date of Service: 02/16/2021 8:45 AM Medical Record Number: [id number] Patient Account Number: [id number] Date of Birth/Gender: [birth year] (41 y.o. M) Treating RN: Carlene Coria Primary Care Physician: Leretha Pol Other Clinician: Referring Physician: Leretha Pol Treating Physician/Extender: Yaakov Guthrie in Treatment: 1 Education Assessment Education Provided To: Patient Education Topics Provided Welcome To [facility]: Methods: Explain/Verbal Responses: State content correctly Electronic Signature(s) Signed: 02/17/2021 9:01:13 AM By: Carlene Coria RN Entered By: Carlene Coria on 02/16/2021 09:38:53 Paul Hanna ([id number]) -------------------------------------------------------------------------------- Wound Assessment Details Patient Name: Paul Hanna Date of Service: 02/16/2021 8:45 AM Medical Record Number: [id number] Patient Account Number: [id number] Date of Birth/Sex: [birth year] (41 y.o. M) Treating RN: Carlene Coria Primary Care Emmagrace Runkel: Leretha Pol Other Clinician: Referring Ophie Burrowes: Leretha Pol Treating Dorotea Hand/Extender: Yaakov Guthrie in Treatment: 1 Wound Status Wound Number: 1 Primary Etiology: Trauma, Other Wound Location: Left Lower Leg Wound Status: Open Wounding Event: Skin Tear/Laceration Date Acquired: 11/01/2020 Weeks Of Treatment: 1 Clustered Wound: No Photos Wound Measurements Length: (cm) 2.1 Width: (cm) 1.5 Depth: (cm) 0.1 Area: (cm) 2.474 Volume: (cm) 0.247 % Reduction in Area: 0% % Reduction in Volume: 0% Epithelialization:  None Tunneling: No Undermining: No Wound  Description Classification: Full Thickness Without Exposed Support Structures Exudate Amount: Medium Exudate Type: Serosanguineous Exudate Color: red, brown Foul Odor After Cleansing: No Slough/Fibrino Yes Wound Bed Granulation Amount: Medium (34-66%) Exposed Structure Granulation Quality: Red Fascia Exposed: No Necrotic Amount: Medium (34-66%) Fat Layer (Subcutaneous Tissue) Exposed: Yes Tendon Exposed: No Muscle Exposed: No Joint Exposed: No Bone Exposed: No Treatment Notes Wound #1 (Lower Leg) Wound Laterality: Left Cleanser [place]ram Ancillary Kit - 15 Day Supply Discharge Instruction: Use supplies as instructed; Kit contains: (15) Saline Bullets; (15) 3x3 Gauze; 15 pr Gloves Normal Saline Discharge Instruction: Wash your hands with soap and water. Remove old dressing, discard into plastic bag and place into trash. Cleanse the wound with Normal Saline prior to applying a clean dressing using gauze sponges, not tissues or cotton balls. Do not Franson, [place] ([id number]) scrub or use excessive force. Pat dry using gauze sponges, not tissue or cotton balls. Soap and Water Discharge Instruction: Gently cleanse wound with antibacterial soap, rinse and pat dry prior to dressing wounds Wound Cleanser Discharge Instruction: Wash your hands with soap and water. Remove old dressing, discard into plastic bag and place into trash. Cleanse the wound with Wound Cleanser prior to applying a clean dressing using gauze sponges, not tissues or cotton balls. Do not scrub or use excessive force. Pat dry using gauze sponges, not tissue or cotton balls. Peri-Wound Care Topical Primary Dressing Hydrofera Blue Ready Transfer Foam, 4x5 (in/in) Discharge Instruction: Apply Hydrofera Blue Ready to wound bed as directed Secondary Dressing Secured With Compression Wrap Profore Lite LF 3 Multilayer Compression Bandaging System Discharge Instruction: Apply 3 multi-layer wrap as prescribed. Compression  Stockings Add-Ons Electronic Signature(s) Signed: 02/17/2021 9:01:13 AM By: Carlene Coria RN Entered By: Carlene Coria on 02/16/2021 09:17:55 Paul Hanna ([id number]) -------------------------------------------------------------------------------- Vitals Details Patient Name: Paul Hanna Date of Service: 02/16/2021 8:45 AM Medical Record Number: [id number] Patient Account Number: [id number] Date of Birth/Sex: 02-08-1980 (41 y.o. M) Treating RN: Carlene Coria Primary Care Hillery Bhalla: Leretha Pol Other Clinician: Referring Macaria Bias: Leretha Pol Treating Destani Wamser/Extender: Yaakov Guthrie in Treatment: 1 Vital Signs Time Taken: 09:03 Temperature (F): 98.4 Height (in): 71 Pulse (bpm): 83 Weight (lbs): 300 Respiratory Rate (breaths/min): 18 Body Mass Index (BMI): 41.8 Blood Pressure (mmHg): 172/112 Reference Range: 80 - 120 mg / dl Electronic Signature(s) Signed: 02/17/2021 9:01:13 AM By: Carlene Coria RN Entered By: Carlene Coria on 02/16/2021 09:04:01

## 2021-02-17 NOTE — Progress Notes (Signed)
ANKUSH, GINTZ ([id number]) Visit Report for 02/16/2021 Chief Complaint Document Details Patient Name: Paul Hanna, Paul Hanna Date of Service: 02/16/2021 8:45 AM Medical Record Number: [id number] Patient Account Number: [id number] Date of Birth/Sex: [birth year] (41 y.o. M) Treating RN: Carlene Coria Primary Care Provider: Leretha Pol Other Clinician: Referring Provider: Leretha Pol Treating Provider/Extender: Yaakov Guthrie in Treatment: 1 Information Obtained from: Patient Chief Complaint Left lower extremity wound due to trauma Electronic Signature(s) Signed: 02/16/2021 10:38:47 AM By: Kalman Shan DO Entered By: Kalman Shan on 02/16/2021 10:35:12 Paul Hanna ([id number]) -------------------------------------------------------------------------------- Debridement Details Patient Name: Paul Hanna Date of Service: 02/16/2021 8:45 AM Medical Record Number: [id number] Patient Account Number: [id number] Date of Birth/Sex: [birth year] (41 y.o. M) Treating RN: Carlene Coria Primary Care Provider: Leretha Pol Other Clinician: Referring Provider: Leretha Pol Treating Provider/Extender: Yaakov Guthrie in Treatment: 1 Debridement Performed for Wound #1 Left Lower Leg Assessment: Performed By: Physician Kalman Shan, MD Debridement Type: Debridement Level of Consciousness (Pre- Awake and Alert procedure): Pre-procedure Verification/Time Out Yes - 09:15 Taken: Start Time: 09:15 Pain Control: Lidocaine 4% Topical Solution Total Area Debrided (L x W): 2.1 (cm) x 1.5 (cm) = 3.15 (cm) Tissue and other material Viable, Slough, Subcutaneous, Slough debrided: Level: Skin/Subcutaneous Tissue Debridement Description: Excisional Instrument: Curette Bleeding: Minimum Hemostasis Achieved: Pressure End Time: 09:22 Procedural Pain: 0 Post Procedural Pain: 0 Response to Treatment: Procedure was tolerated well Level of Consciousness (Post- Awake and  Alert procedure): Post Debridement Measurements of Total Wound Length: (cm) 2.1 Width: (cm) 1.5 Depth: (cm) 0.1 Volume: (cm) 0.247 Character of Wound/Ulcer Post Debridement: Improved Post Procedure Diagnosis Same as Pre-procedure Electronic Signature(s) Signed: 02/16/2021 10:38:47 AM By: Kalman Shan DO Signed: 02/17/2021 9:01:13 AM By: Carlene Coria RN Entered By: Carlene Coria on 02/16/2021 09:38:07 Paul Hanna ([id number]) -------------------------------------------------------------------------------- HPI Details Patient Name: Paul Hanna Date of Service: 02/16/2021 8:45 AM Medical Record Number: [id number] Patient Account Number: [id number] Date of Birth/Sex: [birth year] (41 y.o. M) Treating RN: Carlene Coria Primary Care Provider: Leretha Pol Other Clinician: Referring Provider: Leretha Pol Treating Provider/Extender: Yaakov Guthrie in Treatment: 1 History of Present Illness HPI Description: Admission 02/09/2021 Paul Hanna is a 41 year old male otherwise healthy that presents to the clinic for an abrasion to the left leg caused by a cement wall on Halloween night. He originally took care of the wound and use Neosporin and kept the area clean however it has not healed. He has chronic pain to the area. He visited the ED on 01/18/2021 for this issue and they treated him with doxycycline and mupirocin ointment. He completed treatment and followed up with his family medicine office Who started him on ciprofloxacin. He also completed this course. He reports no improvement to the wound site after these courses of antibiotics. He denies ever having an x-ray of the area. 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 2/15; Patient present for Follow-up. He has been using gentamicin ointment with Hydrofera Blue daily. He has no issues or complaints today. He denies signs of infection. Electronic Signature(s) Signed: 02/16/2021 10:38:47 AM By:  Kalman Shan DO Entered By: Kalman Shan on 02/16/2021 10:36:06 Paul Hanna ([id number]) -------------------------------------------------------------------------------- Physical Exam Details Patient Name: Paul Hanna Date of Service: 02/16/2021 8:45 AM Medical Record Number: [id number] Patient Account Number: [id number] Date of Birth/Sex: August 10, 1980 (41 y.o. M) Treating RN: Carlene Coria Primary Care Provider: Leretha Pol Other Clinician: Referring Provider: Leretha Pol Treating Provider/Extender: Yaakov Guthrie in Treatment: 1 Constitutional .  Cardiovascular . Psychiatric . Notes Left lower extremity: To the anterior aspect there is an open wound with granulation tissue and nonviable tissue present. No surrounding signs of infection. Varicose veins noted Electronic Signature(s) Signed: 02/16/2021 10:38:47 AM By: Kalman Shan DO Entered By: Kalman Shan on 02/16/2021 10:36:28 Paul Hanna ([id number]) -------------------------------------------------------------------------------- Physician Orders Details Patient Name: Paul Hanna Date of Service: 02/16/2021 8:45 AM Medical Record Number: [id number] Patient Account Number: [id number] Date of Birth/Sex: [birth year] (41 y.o. M) Treating RN: Carlene Coria Primary Care Provider: Leretha Pol Other Clinician: Referring Provider: Leretha Pol Treating Provider/Extender: Yaakov Guthrie in Treatment: 1 Verbal / Phone Orders: No Diagnosis Coding Follow-up Appointments o Return Appointment in 1 week. Bathing/ Shower/ Hygiene o May shower with wound dressing protected with water repellent cover or cast protector. o No tub bath. Anesthetic (Use 'Patient Medications' Section for Anesthetic Order Entry) o Lidocaine applied to wound bed Edema Control - Lymphedema / Segmental Compressive Device / Other o Optional: One layer of unna paste to top of compression wrap (to act as an  anchor). o Patient to wear own compression stockings. Remove compression stockings every night before going to bed and put on every morning when getting up. o Elevate leg(s) parallel to the floor when sitting. Additional Orders / Instructions o Follow Nutritious Diet and Increase Protein Intake Medications-Please add to medication list. o Take one 536m Tylenol (Acetaminophen) and one 2062mMotrin (Ibuprofen) every 6 hours for pain. Do not take ibuprofen if you are on blood thinners or have stomach ulcers. Wound Treatment Wound #1 - Lower Leg Wound Laterality: Left Cleanser: Byram Ancillary Kit - 15 Day Supply (Generic) 1 x Per Day/15 Days Discharge Instructions: Use supplies as instructed; Kit contains: (15) Saline Bullets; (15) 3x3 Gauze; 15 pr Gloves Cleanser: Normal Saline 1 x Per Day/15 Days Discharge Instructions: Wash your hands with soap and water. Remove old dressing, discard into plastic bag and place into trash. Cleanse the wound with Normal Saline prior to applying a clean dressing using gauze sponges, not tissues or cotton balls. Do not scrub or use excessive force. Pat dry using gauze sponges, not tissue or cotton balls. Cleanser: Soap and Water 1 x Per Day/15 Days Discharge Instructions: Gently cleanse wound with antibacterial soap, rinse and pat dry prior to dressing wounds Cleanser: Wound Cleanser 1 x Per Day/15 Days Discharge Instructions: Wash your hands with soap and water. Remove old dressing, discard into plastic bag and place into trash. Cleanse the wound with Wound Cleanser prior to applying a clean dressing using gauze sponges, not tissues or cotton balls. Do not scrub or use excessive force. Pat dry using gauze sponges, not tissue or cotton balls. Primary Dressing: Hydrofera Blue Ready Transfer Foam, 4x5 (in/in) 1 x Per Day/15 Days Discharge Instructions: Apply Hydrofera Blue Ready to wound bed as directed Compression Wrap: Profore Lite LF 3 Multilayer  Compression Bandaging System 1 x Per Day/15 Days Discharge Instructions: Apply 3 multi-layer wrap as prescribed. Electronic Signature(s) Signed: 02/16/2021 10:38:47 AM By: HoKalman ShanO Entered By: HoKalman Shann 02/16/2021 10:38:22 Paul [id number]-------------------------------------------------------------------------------- Problem List Details Patient Name: Paul Feinsteinate of Service: 02/16/2021 8:45 AM Medical Record Number: [id number] Account Number: [id number] of Birth/Sex: [birth year].o. 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 M) Treating RN: Carlene Coria Primary Care Provider: Leretha Pol Other Clinician: Referring Provider: Leretha Pol Treating Provider/Extender: Yaakov Guthrie in Treatment: 1 Subjective Chief Complaint Information obtained from Patient Left lower extremity wound due to trauma History of Present Illness (HPI) Admission 02/09/2021 Mr. Timothy Trudell is a 40 year old male otherwise healthy that presents to the clinic for an abrasion to the  left leg caused by a cement wall on Halloween night. He originally took care of the wound and use Neosporin and kept the area clean however it has not healed. He has chronic pain to the area. He visited the ED on 01/18/2021 for this issue and they treated him with doxycycline and mupirocin ointment. He completed treatment and followed up with his family medicine office Who started him on ciprofloxacin. He also completed this course. He reports no improvement to the wound site after these courses of antibiotics. He denies ever having an x-ray of the area. 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 2/15; Patient present for Follow-up. He has been using gentamicin ointment with Hydrofera Blue daily. He has no issues or complaints today. He denies signs of infection. Objective Constitutional Vitals Time Taken: 9:03 AM, Height: 71 in, Weight: 300 lbs, BMI: 41.8, Temperature: 98.4 F, Pulse: 83 bpm, Respiratory Rate: 18 breaths/min, Blood Pressure: 172/112 mmHg. General Notes: Left lower extremity: To the anterior aspect there is an open wound with granulation tissue and nonviable tissue present. No surrounding signs of infection. Varicose veins noted Integumentary (Hair, Skin) Wound #1 status is Open. Original cause of wound was Skin Tear/Laceration. The date acquired was: 11/01/2020. The wound has been in treatment 1 weeks. The wound is located on the Left Lower Leg. The wound measures 2.1cm length x 1.5cm width x 0.1cm depth; 2.474cm^2 area and 0.247cm^3 volume. There is Fat Layer (Subcutaneous Tissue) exposed. There is no tunneling or undermining noted. There is a medium amount of serosanguineous drainage noted. There is medium (34-66%) red granulation within the wound bed. There is a medium (34-66%) amount of necrotic tissue within the wound bed. Assessment Active Problems ICD-10 Non-pressure chronic ulcer of other part of left lower leg with fat layer  exposed Other early complications of trauma, initial encounter Patient's wound has shown improvement in appearance since last clinic visit. I debrided nonviable tissue. No signs of surrounding infection. The culture Done at last clinic visit did not have any growth. At this time I recommended stopping gentamicin and continuing Hydrofera Blue. I think he would benefit from a compression wrap at this point. He is on his feet all day for work and he does have varicose veins noted on exam. Follow-up in 1 week. ROHIN, KREJCI ([id number]) Procedures Wound #1 Pre-procedure diagnosis of Wound #1 is a Trauma, Other located on the Left Lower Leg . There was a Excisional Skin/Subcutaneous Tissue Debridement with a total area of 3.15 sq cm performed by Kalman Shan, MD. With the following instrument(s): Curette to remove Viable tissue/material. Material removed includes Subcutaneous Tissue and Slough and after achieving pain  control using Lidocaine 4% Topical Solution. No specimens were taken. A time out was conducted at 09:15, prior to the start of the procedure. A Minimum amount of bleeding was controlled with Pressure. The procedure was tolerated well with a pain level of 0 throughout and a pain level of 0 following the procedure. Post Debridement Measurements: 2.1cm length x 1.5cm width x 0.1cm depth; 0.247cm^3 volume. Character of Wound/Ulcer Post Debridement is improved. Post procedure Diagnosis Wound #1: Same as Pre-Procedure Pre-procedure diagnosis of Wound #1 is a Trauma, Other located on the Left Lower Leg . There was a Three Layer Compression Therapy Procedure by Carlene Coria, RN. Post procedure Diagnosis Wound #1: Same as Pre-Procedure Plan Follow-up Appointments: Return Appointment in 1 week. Bathing/ Shower/ Hygiene: May shower with wound dressing protected with water repellent cover or cast protector. No tub bath. Anesthetic (Use 'Patient Medications' Section for Anesthetic Order  Entry): Lidocaine applied to wound bed Edema Control - Lymphedema / Segmental Compressive Device / Other: Optional: One layer of unna paste to top of compression wrap (to act as an anchor). Patient to wear own compression stockings. Remove compression stockings every night before going to bed and put on every morning when getting up. Elevate leg(s) parallel to the floor when sitting. Additional Orders / Instructions: Follow Nutritious Diet and Increase Protein Intake Medications-Please add to medication list.: Take one 525m Tylenol (Acetaminophen) and one 2019mMotrin (Ibuprofen) every 6 hours for pain. Do not take ibuprofen if you are on blood thinners or have stomach ulcers. WOUND #1: - Lower Leg Wound Laterality: Left Cleanser: Byram Ancillary Kit - 15 Day Supply (Generic) 1 x Per Day/15 Days Discharge Instructions: Use supplies as instructed; Kit contains: (15) Saline Bullets; (15) 3x3 Gauze; 15 pr Gloves Cleanser: Normal Saline 1 x Per Day/15 Days Discharge Instructions: Wash your hands with soap and water. Remove old dressing, discard into plastic bag and place into trash. Cleanse the wound with Normal Saline prior to applying a clean dressing using gauze sponges, not tissues or cotton balls. Do not scrub or use excessive force. Pat dry using gauze sponges, not tissue or cotton balls. Cleanser: Soap and Water 1 x Per Day/15 Days Discharge Instructions: Gently cleanse wound with antibacterial soap, rinse and pat dry prior to dressing wounds Cleanser: Wound Cleanser 1 x Per Day/15 Days Discharge Instructions: Wash your hands with soap and water. Remove old dressing, discard into plastic bag and place into trash. Cleanse the wound with Wound Cleanser prior to applying a clean dressing using gauze sponges, not tissues or cotton balls. Do not scrub or use excessive force. Pat dry using gauze sponges, not tissue or cotton balls. Primary Dressing: Hydrofera Blue Ready Transfer Foam, 4x5  (in/in) 1 x Per Day/15 Days Discharge Instructions: Apply Hydrofera Blue Ready to wound bed as directed Compression Wrap: Profore Lite LF 3 Multilayer Compression Bandaging System 1 x Per Day/15 Days Discharge Instructions: Apply 3 multi-layer wrap as prescribed. 1. In office sharp debridement 2. Hydrofera Blue under 3 layer compression 3. Follow-up in 1 week Electronic Signature(s) Signed: 02/16/2021 10:38:47 AM By: HoKalman ShanO Entered By: HoKalman Shann 02/16/2021 10:37:57 Paul [id number]-------------------------------------------------------------------------------- SuperBill Details Patient Name: Paul Feinsteinate of Service: 02/16/2021 Medical Record Number: [id number] Account Number: [id number] of Birth/Sex: [birth year]047.o. M) Treating RN: EpCarlene Coriarimary Care Provider: BoLeretha Polther Clinician: Referring Provider: BoLeretha Polreating Provider/Extender: HoYaakov Guthrien Treatment: 1 Diagnosis Coding ICD-10 Codes Code Description [id number]-pressure chronic  ulcer of other part of left lower leg with fat layer exposed T79.8XXA Other early complications of trauma, initial encounter Facility Procedures CPT4 Code: [id number] Description: 37793 - DEB SUBQ TISSUE 20 SQ CM/< Modifier: Quantity: 1 CPT4 Code: Description: ICD-10 Diagnosis Description L97.822 Non-pressure chronic ulcer of other part of left lower leg with fat layer Modifier: exposed Quantity: Physician Procedures CPT4 Code: [id number] Description: 11042 - WC PHYS SUBQ TISS 20 SQ CM Modifier: Quantity: 1 CPT4 Code: Description: ICD-10 Diagnosis Description L97.822 Non-pressure chronic ulcer of other part of left lower leg with fat layer Modifier: exposed Quantity: Electronic Signature(s) Signed: 02/16/2021 10:38:47 AM By: Kalman Shan DO Entered By: Kalman Shan on 02/16/2021 10:38:07

## 2021-02-23 ENCOUNTER — Other Ambulatory Visit: Payer: Self-pay

## 2021-02-23 ENCOUNTER — Encounter (HOSPITAL_BASED_OUTPATIENT_CLINIC_OR_DEPARTMENT_OTHER): Payer: BC Managed Care – PPO | Admitting: Internal Medicine

## 2021-02-23 DIAGNOSIS — L97822 Non-pressure chronic ulcer of other part of left lower leg with fat layer exposed: Secondary | ICD-10-CM

## 2021-02-23 DIAGNOSIS — I87312 Chronic venous hypertension (idiopathic) with ulcer of left lower extremity: Secondary | ICD-10-CM | POA: Diagnosis not present

## 2021-02-23 NOTE — Progress Notes (Signed)
ZAQUAN, DUFFNER ([id number]) Visit Report for 02/23/2021 Arrival Information Details Patient Name: Paul Hanna, Paul Hanna Date of Service: 02/23/2021 11:30 AM Medical Record Number: [id number] Patient Account Number: [id number] Date of Birth/Sex: 09-18-1980 (41 y.o. M) Treating RN: Donnamarie Poag Primary Care Bernardina Cacho: Leretha Pol Other Clinician: Referring Armanii Pressnell: Leretha Pol Treating Jerris Keltz/Extender: Yaakov Guthrie in Treatment: 2 Visit Information History Since Last Visit Added or deleted any medications: No Patient Arrived: Ambulatory Had a fall or experienced change in No Arrival Time: 11:22 activities of daily living that may affect Accompanied By: self risk of falls: Transfer Assistance: None Hospitalized since last visit: No Patient Identification Verified: Yes Has Dressing in Place as Prescribed: Yes Secondary Verification Process Completed: Yes Has Compression in Place as Prescribed: Yes Patient Requires Transmission-Based Precautions: No Pain Present Now: No Patient Has Alerts: Yes Patient Alerts: NOT Diabetic Electronic Signature(s) Signed: 02/23/2021 1:59:49 PM By: Donnamarie Poag Entered By: Donnamarie Poag on 02/23/2021 11:23:00 Paul Hanna ([id number]) -------------------------------------------------------------------------------- Compression Therapy Details Patient Name: Paul Hanna Date of Service: 02/23/2021 11:30 AM Medical Record Number: [id number] Patient Account Number: [id number] Date of Birth/Sex: 04/01/1980 (41 y.o. M) Treating RN: Donnamarie Poag Primary Care Anaya Bovee: Leretha Pol Other Clinician: Referring Lorna Strother: Leretha Pol Treating Anayiah Howden/Extender: Yaakov Guthrie in Treatment: 2 Compression Therapy Performed for Wound Assessment: Wound #1 Left Lower Leg Performed By: Clinician Donnamarie Poag, RN Compression Type: Three Layer Post Procedure Diagnosis Same as Pre-procedure Electronic Signature(s) Signed: 02/23/2021 1:59:49 PM By:  Donnamarie Poag Entered By: Donnamarie Poag on 02/23/2021 11:42:20 Paul Hanna ([id number]) -------------------------------------------------------------------------------- Encounter Discharge Information Details Patient Name: Paul Hanna Date of Service: 02/23/2021 11:30 AM Medical Record Number: [id number] Patient Account Number: [id number] Date of Birth/Sex: 03/16/1980 (41 y.o. M) Treating RN: Donnamarie Poag Primary Care [employer] Belay: Leretha Pol Other Clinician: Referring Verina Galeno: Leretha Pol Treating Capone Schwinn/Extender: Yaakov Guthrie in Treatment: 2 Encounter Discharge Information Items Post Procedure Vitals Discharge Condition: Stable Temperature (F): 98.2 Ambulatory Status: Ambulatory Pulse (bpm): 92 Discharge Destination: Home Respiratory Rate (breaths/min): 16 Transportation: Private Auto Blood Pressure (mmHg): 158/102 Accompanied By: self Schedule Follow-up Appointment: Yes Clinical Summary of Care: Electronic Signature(s) Signed: 02/23/2021 1:59:49 PM By: Donnamarie Poag Entered By: Donnamarie Poag on 02/23/2021 11:59:36 Paul Hanna ([id number]) -------------------------------------------------------------------------------- Lower Extremity Assessment Details Patient Name: Paul Hanna Date of Service: 02/23/2021 11:30 AM Medical Record Number: [id number] Patient Account Number: [id number] Date of Birth/Sex: [birth year] (41 y.o. M) Treating RN: Donnamarie Poag Primary Care Hashim Eichhorst: Leretha Pol Other Clinician: Referring Yarelie Hams: Leretha Pol Treating Lanetra Hartley/Extender: Yaakov Guthrie in Treatment: 2 Edema Assessment Assessed: [Left: Yes] [Right: No] [Left: Edema] [Right: :] Calf Left: Right: Point of Measurement: 31 cm From Medial Instep 46 cm Ankle Left: Right: Point of Measurement: 10 cm From Medial Instep 26.5 cm Vascular Assessment Pulses: Dorsalis Pedis Palpable: [Left:Yes] Electronic Signature(s) Signed: 02/23/2021 1:59:49 PM By: Donnamarie Poag Entered By: Donnamarie Poag on 02/23/2021 11:31:51 Paul Hanna ([id number]) -------------------------------------------------------------------------------- Multi Wound Chart Details Patient Name: Paul Hanna Date of Service: 02/23/2021 11:30 AM Medical Record Number: [id number] Patient Account Number: [id number] Date of Birth/Sex: 1980-01-06 (41 y.o. M) Treating RN: Donnamarie Poag Primary Care Verlyn Lambert: Leretha Pol Other Clinician: Referring Daphanie Oquendo: Leretha Pol Treating Everette Dimauro/Extender: Yaakov Guthrie in Treatment: 2 Vital Signs Height(in): 71 Pulse(bpm): 5 Weight(lbs): 300 Blood Pressure(mmHg): 158/102 Body Mass Index(BMI): 41.8 Temperature(F): 98.2 Respiratory Rate(breaths/min): 16 Photos: [N/A:N/A] Wound Location: Left Lower Leg N/A N/A Wounding Event: Skin Tear/Laceration N/A N/A Primary Etiology: Trauma, Other N/A N/A Date Acquired: 11/01/2020 N/A N/A Weeks of Treatment: 2 N/A  N/A Wound Status: Open N/A N/A Wound Recurrence: No N/A N/A Measurements L x W x D (cm) 1.6x1.3x0.1 N/A N/A Area (cm) : 1.634 N/A N/A Volume (cm) : 0.163 N/A N/A % Reduction in Area: 34.00% N/A N/A % Reduction in Volume: 34.00% N/A N/A Classification: Full Thickness Without Exposed N/A N/A Support Structures Exudate Amount: Medium N/A N/A Exudate Type: Serosanguineous N/A N/A Exudate Color: red, brown N/A N/A Granulation Amount: Large (67-100%) N/A N/A Granulation Quality: Red, Hyper-granulation N/A N/A Necrotic Amount: Small (1-33%) N/A N/A Exposed Structures: Fat Layer (Subcutaneous Tissue): N/A N/A Yes Fascia: No Tendon: No Muscle: No Joint: No Bone: No Epithelialization: None N/A N/A Treatment Notes Electronic Signature(s) Signed: 02/23/2021 1:59:49 PM By: Donnamarie Poag Entered By: Donnamarie Poag on 02/23/2021 11:32:34 Paul Hanna ([id number]) -------------------------------------------------------------------------------- [place]  Details Patient Name: Paul Hanna Date of Service: 02/23/2021 11:30 AM Medical Record Number: [id number] Patient Account Number: [id number] Date of Birth/Sex: [birth year] (41 y.o. M) Treating RN: Donnamarie Poag Primary Care Pierrette Scheu: Leretha Pol Other Clinician: Referring Lariah Fleer: Leretha Pol Treating Keli Buehner/Extender: Yaakov Guthrie in Treatment: 2 Active Inactive Wound/Skin Impairment Nursing Diagnoses: Impaired tissue integrity Knowledge deficit related to smoking impact on wound healing Knowledge deficit related to ulceration/compromised skin integrity Goals: Patient/caregiver will verbalize understanding of skin care regimen Date Initiated: 02/09/2021 Date Inactivated: 02/23/2021 Target Resolution Date: 02/25/2021 Goal Status: Met Ulcer/skin breakdown will have a volume reduction of 30% by week 4 Date Initiated: 02/09/2021 Target Resolution Date: 03/09/2021 Goal Status: Active Ulcer/skin breakdown will have a volume reduction of 50% by week 8 Date Initiated: 02/09/2021 Target Resolution Date: 04/06/2021 Goal Status: Active Ulcer/skin breakdown will have a volume reduction of 80% by week 12 Date Initiated: 02/09/2021 Target Resolution Date: 05/04/2021 Goal Status: Active Ulcer/skin breakdown will heal within 14 weeks Date Initiated: 02/09/2021 Target Resolution Date: 05/18/2021 Goal Status: Active Interventions: Assess patient/caregiver ability to obtain necessary supplies Assess patient/caregiver ability to perform ulcer/skin care regimen upon admission and as needed Assess ulceration(s) every visit Notes: Electronic Signature(s) Signed: 02/23/2021 1:59:49 PM By: Donnamarie Poag Entered By: Donnamarie Poag on 02/23/2021 11:32:09 Paul Hanna ([id number]) -------------------------------------------------------------------------------- Pain Assessment Details Patient Name: Paul Hanna Date of Service: 02/23/2021 11:30 AM Medical Record Number: [id number] Patient Account Number:  [id number] Date of Birth/Sex: 1980/02/02 (41 y.o. M) Treating RN: Donnamarie Poag Primary Care Reka Wist: Leretha Pol Other Clinician: Referring Leonides Minder: Leretha Pol Treating Tewana Bohlen/Extender: Yaakov Guthrie in Treatment: 2 Active Problems Location of Pain Severity and Description of Pain Patient Has Paino No Site Locations Rate the pain. Current Pain Level: 0 Pain Management and Medication Current Pain Management: Electronic Signature(s) Signed: 02/23/2021 1:59:49 PM By: Donnamarie Poag Entered By: Donnamarie Poag on 02/23/2021 11:25:54 Paul Hanna ([id number]) -------------------------------------------------------------------------------- Patient/Caregiver Education Details Patient Name: Paul Hanna Date of Service: 02/23/2021 11:30 AM Medical Record Number: [id number] Patient Account Number: [id number] Date of Birth/Gender: Mar 29, 1980 (41 y.o. M) Treating RN: Donnamarie Poag Primary Care Physician: Leretha Pol Other Clinician: Referring Physician: Leretha Pol Treating Physician/Extender: Yaakov Guthrie in Treatment: 2 Education Assessment Education Provided To: Patient Education Topics Provided Wound Debridement: Wound/Skin Impairment: Electronic Signature(s) Signed: 02/23/2021 1:59:49 PM By: Donnamarie Poag Entered By: Donnamarie Poag on 02/23/2021 [place], Julez ([id number]) -------------------------------------------------------------------------------- Wound Assessment Details Patient Name: Paul Hanna Date of Service: 02/23/2021 11:30 AM Medical Record Number: [id number] Patient Account Number: [id number] Date of Birth/Sex: May 03, 1980 (41 y.o. M) Treating RN: Donnamarie Poag Primary Care Mayela Bullard: Leretha Pol Other Clinician: Referring Cheralyn Oliver: Leretha Pol Treating Remedy Corporan/Extender: Yaakov Guthrie in Treatment:  2 Wound Status Wound Number: 1 Primary Etiology: Trauma, Other Wound Location: Left Lower Leg Wound Status: Open Wounding  Event: Skin Tear/Laceration Date Acquired: 11/01/2020 Weeks Of Treatment: 2 Clustered Wound: No Photos Wound Measurements Length: (cm) 1.6 Width: (cm) 1.3 Depth: (cm) 0.1 Area: (cm) 1.634 Volume: (cm) 0.163 % Reduction in Area: 34% % Reduction in Volume: 34% Epithelialization: None Tunneling: No Undermining: No Wound Description Classification: Full Thickness Without Exposed Support Structures Exudate Amount: Medium Exudate Type: Serosanguineous Exudate Color: red, brown Foul Odor After Cleansing: No Slough/Fibrino Yes Wound Bed Granulation Amount: Large (67-100%) Exposed Structure Granulation Quality: Red, Hyper-granulation Fascia Exposed: No Necrotic Amount: Small (1-33%) Fat Layer (Subcutaneous Tissue) Exposed: Yes Necrotic Quality: Adherent Slough Tendon Exposed: No Muscle Exposed: No Joint Exposed: No Bone Exposed: No Treatment Notes Wound #1 (Lower Leg) Wound Laterality: Left Cleanser Normal Saline Discharge Instruction: Wash your hands with soap and water. Remove old dressing, discard into plastic bag and place into trash. Cleanse the wound with Normal Saline prior to applying a clean dressing using gauze sponges, not tissues or cotton balls. Do not scrub or use excessive force. Pat dry using gauze sponges, not tissue or cotton balls. Soap and Water [place], [place] ([id number]) Discharge Instruction: Gently cleanse wound with antibacterial soap, rinse and pat dry prior to dressing wounds Wound Cleanser Discharge Instruction: Wash your hands with soap and water. Remove old dressing, discard into plastic bag and place into trash. Cleanse the wound with Wound Cleanser prior to applying a clean dressing using gauze sponges, not tissues or cotton balls. Do not scrub or use excessive force. Pat dry using gauze sponges, not tissue or cotton balls. Peri-Wound Care Topical Primary Dressing Hydrofera Blue Ready Transfer Foam, 4x5 (in/in) Discharge Instruction: Apply  Hydrofera Blue Ready to wound bed as directed Secondary Dressing Secured With Compression Wrap Profore Lite LF 3 Multilayer Compression Bandaging System Discharge Instruction: Apply 3 multi-layer wrap as prescribed. Compression Stockings Add-Ons Electronic Signature(s) Signed: 02/23/2021 1:59:49 PM By: Donnamarie Poag Entered By: Donnamarie Poag on 02/23/2021 11:31:03 Paul Hanna ([id number]) -------------------------------------------------------------------------------- [place] Details Patient Name: Paul Hanna Date of Service: 02/23/2021 11:30 AM Medical Record Number: [id number] Patient Account Number: [id number] Date of Birth/Sex: January 09, 1980 (41 y.o. 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

## 2021-02-23 NOTE — Progress Notes (Signed)
Paul Hanna ([id number]) Visit Report for 02/23/2021 Chief Complaint Document Details Patient Name: Paul Hanna Date of Service: 02/23/2021 11:30 AM Medical Record Number: [id number] Patient Account Number: [id number] Date of Birth/Sex: 1980-04-29 (41 y.o. M) Treating RN: Donnamarie Poag Primary Care Provider: Leretha Pol Other Clinician: Referring Provider: Leretha Pol Treating Provider/Extender: Yaakov Guthrie in Treatment: 2 Information Obtained from: Patient Chief Complaint Left lower extremity wound due to trauma Electronic Signature(s) Signed: 02/23/2021 12:02:46 PM By: Kalman Shan DO Entered By: Kalman Shan on 02/23/2021 11:58:05 Paul Hanna ([id number]) -------------------------------------------------------------------------------- Debridement Details Patient Name: Paul Hanna Date of Service: 02/23/2021 11:30 AM Medical Record Number: [id number] Patient Account Number: [id number] Date of Birth/Sex: February 21, 1980 (42 y.o. M) Treating RN: Donnamarie Poag Primary Care Provider: Leretha Pol Other Clinician: Referring Provider: Leretha Pol Treating Provider/Extender: Yaakov Guthrie in Treatment: 2 Debridement Performed for Wound #1 Left Lower Leg Assessment: Performed By: Physician Kalman Shan, MD Debridement Type: Debridement Level of Consciousness (Pre- Awake and Alert procedure): Pre-procedure Verification/Time Out Yes - 11:43 Taken: Start Time: 11:43 Pain Control: Lidocaine Total Area Debrided (L x W): 1.6 (cm) x 1.3 (cm) = 2.08 (cm) Tissue and other material Viable, Non-Viable, Subcutaneous debrided: Level: Skin/Subcutaneous Tissue Debridement Description: Excisional Instrument: Curette Bleeding: Minimum Hemostasis Achieved: Silver Nitrate Response to Treatment: Procedure was tolerated well Level of Consciousness (Post- Awake and Alert procedure): Post Debridement Measurements of Total Wound Length: (cm) 1.6 Width:  (cm) 1.3 Depth: (cm) 0.1 Volume: (cm) 0.163 Character of Wound/Ulcer Post Debridement: Improved Post Procedure Diagnosis Same as Pre-procedure Electronic Signature(s) Signed: 02/23/2021 12:02:46 PM By: Kalman Shan DO Signed: 02/23/2021 1:59:49 PM By: Donnamarie Poag Entered By: Donnamarie Poag on 02/23/2021 11:44:14 Paul Hanna ([id number]) -------------------------------------------------------------------------------- HPI Details Patient Name: Paul Hanna Date of Service: 02/23/2021 11:30 AM Medical Record Number: [id number] Patient Account Number: [id number] Date of Birth/Sex: 1980-10-21 (41 y.o. M) Treating RN: Donnamarie Poag Primary Care Provider: Leretha Pol Other Clinician: Referring Provider: Leretha Pol Treating Provider/Extender: Yaakov Guthrie in Treatment: 2 History of Present Illness HPI Description: Admission 02/09/2021 Paul Hanna is a 41 year old male otherwise healthy that presents to the clinic for an abrasion to the left leg caused by a cement wall on Halloween night. He originally took care of the wound and use Neosporin and kept the area clean however it has not healed. He has chronic pain to the area. He visited the ED on 01/18/2021 for this issue and they treated him with doxycycline and mupirocin ointment. He completed treatment and followed up with his family medicine office Who started him on ciprofloxacin. He also completed this course. He reports no improvement to the wound site after these courses of antibiotics. He denies ever having an x-ray of the area. 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 He is on his feet most of the day. 2/15; Patient present for Follow-up. He has been using gentamicin ointment with Hydrofera Blue daily. He has no issues or complaints today. He denies signs of infection. 2/22; patient presents for follow-up. He tolerated the compression wrap well. He has no issues or complaints today. He denies signs of infection. He  reports improvement in chronic pain to the wound site. Electronic Signature(s) Signed: 02/23/2021 12:02:46 PM By: Kalman Shan DO Entered By: Kalman Shan on 02/23/2021 11:58:41 Paul Hanna ([id number]) -------------------------------------------------------------------------------- Physical Exam Details Patient Name: Paul Hanna Date of Service: 02/23/2021 11:30 AM Medical Record Number: [id number] Patient Account Number: [id number] Date of Birth/Sex: 06/05/1980 (41 y.o. M) Treating RN: Donnamarie Poag Primary Care  Provider: Leretha Pol Other Clinician: Referring Provider: Leretha Pol Treating Provider/Extender: Yaakov Guthrie in Treatment: 2 Constitutional . Cardiovascular . Psychiatric . Notes Left lower extremity: To the anterior aspect there is an open wound with granulation tissue, Hyper granulated tissue and nonviable tissue present. No surrounding signs of infection. Varicose veins noted Electronic Signature(s) Signed: 02/23/2021 12:02:46 PM By: Kalman Shan DO Entered By: Kalman Shan on 02/23/2021 11:59:10 Paul Hanna ([id number]) -------------------------------------------------------------------------------- Physician Orders Details Patient Name: Paul Hanna Date of Service: 02/23/2021 11:30 AM Medical Record Number: [id number] Patient Account Number: [id number] Date of Birth/Sex: Nov 08, 1980 (41 y.o. M) Treating RN: Donnamarie Poag Primary Care Provider: Leretha Pol Other Clinician: Referring Provider: Leretha Pol Treating Provider/Extender: Yaakov Guthrie in Treatment: 2 Verbal / Phone Orders: No Diagnosis Coding Follow-up Appointments o Return Appointment in 1 week. o Nurse Visit as needed Bathing/ Shower/ Hygiene o May shower with wound dressing protected with water repellent cover or cast protector. o No tub bath. Anesthetic (Use 'Patient Medications' Section for Anesthetic Order Entry) o Lidocaine applied  to wound bed Edema Control - Lymphedema / Segmental Compressive Device / Other o Optional: One layer of unna paste to top of compression wrap (to act as an anchor). o Patient to wear own compression stockings. Remove compression stockings every night before going to bed and put on every morning when getting up. o Elevate leg(s) parallel to the floor when sitting. Additional Orders / Instructions o Follow Nutritious Diet and Increase Protein Intake Medications-Please add to medication list. o Take one 500mg  Tylenol (Acetaminophen) and one 200mg  Motrin (Ibuprofen) every 6 hours for pain. Do not take ibuprofen if you are on blood thinners or have stomach ulcers. Wound Treatment Wound #1 - Lower Leg Wound Laterality: Left Cleanser: Normal Saline 1 x Per [id number] Days Discharge Instructions: Wash your hands with soap and water. Remove old dressing, discard into plastic bag and place into trash. Cleanse the wound with Normal Saline prior to applying a clean dressing using gauze sponges, not tissues or cotton balls. Do not scrub or use excessive force. Pat dry using gauze sponges, not tissue or cotton balls. Cleanser: Soap and Water 1 x Per Day/15 Days Discharge Instructions: Gently cleanse wound with antibacterial soap, rinse and pat dry prior to dressing wounds Cleanser: Wound Cleanser 1 x Per Day/15 Days Discharge Instructions: Wash your hands with soap and water. Remove old dressing, discard into plastic bag and place into trash. Cleanse the wound with Wound Cleanser prior to applying a clean dressing using gauze sponges, not tissues or cotton balls. Do not scrub or use excessive force. Pat dry using gauze sponges, not tissue or cotton balls. Primary Dressing: Hydrofera Blue Ready Transfer Foam, 4x5 (in/in) 1 x Per Day/15 Days Discharge Instructions: Apply Hydrofera Blue Ready to wound bed as directed Compression Wrap: Profore Lite LF 3 Multilayer Compression Bandaging System 1 x Per  Day/15 Days Discharge Instructions: Apply 3 multi-layer wrap as prescribed. Electronic Signature(s) Signed: 02/23/2021 12:02:46 PM By: Kalman Shan DO Entered By: Kalman Shan on 02/23/2021 12:02:10 Paul Hanna ([id number]) -------------------------------------------------------------------------------- Problem List Details Patient Name: Paul Hanna Date of Service: 02/23/2021 11:30 AM Medical Record Number: [id number] Patient Account Number: [id number] Date of Birth/Sex: 1980/09/19 (41 y.o. M) Treating RN: Donnamarie Poag Primary Care Provider: Leretha Pol Other Clinician: Referring Provider: Leretha Pol Treating Provider/Extender: Yaakov Guthrie in Treatment: 2 Active Problems ICD-10 Encounter Code Description Active Date MDM Diagnosis [phone] Non-pressure chronic ulcer of other part of left lower leg with fat layer  02/09/2021 No Yes exposed T79.8XXA Other early complications of trauma, initial encounter 02/09/2021 No Yes I87.312 Chronic venous hypertension (idiopathic) with ulcer of left lower 02/23/2021 No Yes extremity Inactive Problems Resolved Problems Electronic Signature(s) Signed: 02/23/2021 12:02:46 PM By: Kalman Shan DO Entered By: Kalman Shan on 02/23/2021 11:57:59 Paul Hanna ([id number]) -------------------------------------------------------------------------------- Progress Note Details Patient Name: Paul Hanna Date of Service: 02/23/2021 11:30 AM Medical Record Number: [id number] Patient Account Number: [id number] Date of Birth/Sex: Jan 29, 1980 (41 y.o. M) Treating RN: Donnamarie Poag Primary Care Provider: Leretha Pol Other Clinician: Referring Provider: Leretha Pol Treating Provider/Extender: Yaakov Guthrie in Treatment: 2 Subjective Chief Complaint Information obtained from Patient Left lower extremity wound due to trauma History of Present Illness (HPI) Admission 02/09/2021 Paul Hanna is a 41 year old male  otherwise healthy that presents to the clinic for an abrasion to the left leg caused by a cement wall on Halloween night. He originally took care of the wound and use Neosporin and kept the area clean however it has not healed. He has chronic pain to the area. He visited the ED on 01/18/2021 for this issue and they treated him with doxycycline and mupirocin ointment. He completed treatment and followed up with his family medicine office Who started him on ciprofloxacin. He also completed this course. He reports no improvement to the wound site after these courses of antibiotics. He denies ever having an x-ray of the area. 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 2/15; Patient present for Follow-up. He has been using gentamicin ointment with Hydrofera Blue daily. He has no issues or complaints today. He denies signs of infection. 2/22; patient presents for follow-up. He tolerated the compression wrap well. He has no issues or complaints today. He denies signs of infection. He reports improvement in chronic pain to the wound site. Objective Constitutional Vitals Time Taken: 11:24 AM, Height: 71 in, Weight: 300 lbs, BMI: 41.8, Temperature: 98.2 F, Pulse: 92 bpm, Respiratory Rate: 16 breaths/min, Blood Pressure: 158/102 mmHg. 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 No surrounding signs of infection. Varicose veins noted Integumentary (Hair, Skin) Wound #1 status is Open. Original cause of wound was Skin Tear/Laceration. The date acquired was: 11/01/2020. The wound has been in treatment 2 weeks. The wound is located on the Left Lower Leg. The wound measures 1.6cm length x 1.3cm width x 0.1cm depth; 1.634cm^2 area and  0.163cm^3 volume. There is Fat Layer (Subcutaneous Tissue) exposed. There is no tunneling or undermining noted. There is a medium amount of serosanguineous drainage noted. There is large (67-100%) red, hyper - granulation within the wound bed. There is a small (1-33%) amount of necrotic tissue within the wound bed including Adherent Slough. Assessment Active Problems ICD-10 Non-pressure chronic ulcer of other part of left lower leg with fat layer exposed Other early complications of trauma, initial encounter Chronic venous hypertension (idiopathic) with ulcer of left lower extremity Paul Hanna, Paul Hanna ([id number]) Patient's wound has shown improvement in size and appearance since last clinic visit. I debrided nonviable tissue. I used silver nitrate to the hyper granulated areas. No surrounding signs of infection. I recommended continuing with the current treatment of Hydrofera Blue under 3 layer compression. Follow-up in 1 week. Procedures Wound #1  Pre-procedure diagnosis of Wound #1 is a Trauma, Other located on the Left Lower Leg . There was a Excisional Skin/Subcutaneous Tissue Debridement with a total area of 2.08 sq cm performed by Kalman Shan, MD. With the following instrument(s): Curette to remove Viable and Non-Viable tissue/material. Material removed includes Subcutaneous Tissue after achieving pain control using Lidocaine. A time out was conducted at 11:43, prior to the start of the procedure. A Minimum amount of bleeding was controlled with Silver Nitrate. The procedure was tolerated well. Post Debridement Measurements: 1.6cm length x 1.3cm width x 0.1cm depth; 0.163cm^3 volume. Character of Wound/Ulcer Post Debridement is improved. Post procedure Diagnosis Wound #1: Same as Pre-Procedure Pre-procedure diagnosis of Wound #1 is a Trauma, Other located on the Left Lower Leg . There was a Three Layer Compression Therapy Procedure by Donnamarie Poag, RN. Post procedure Diagnosis Wound #1:  Same as Pre-Procedure Plan Follow-up Appointments: Return Appointment in 1 week. Nurse Visit as needed Bathing/ Shower/ Hygiene: May shower with wound dressing protected with water repellent cover or cast protector. No tub bath. Anesthetic (Use 'Patient Medications' Section for Anesthetic Order Entry): Lidocaine applied to wound bed Edema Control - Lymphedema / Segmental Compressive Device / Other: Optional: One layer of unna paste to top of compression wrap (to act as an anchor). Patient to wear own compression stockings. Remove compression stockings every night before going to bed and put on every morning when getting up. Elevate leg(s) parallel to the floor when sitting. Additional Orders / Instructions: Follow Nutritious Diet and Increase Protein Intake Medications-Please add to medication list.: Take one 500mg  Tylenol (Acetaminophen) and one 200mg  Motrin (Ibuprofen) every 6 hours for pain. Do not take ibuprofen if you are on blood thinners or have stomach ulcers. WOUND #1: - Lower Leg Wound Laterality: Left Cleanser: Normal Saline 1 x Per [id number] Days Discharge Instructions: Wash your hands with soap and water. Remove old dressing, discard into plastic bag and place into trash. Cleanse the wound with Normal Saline prior to applying a clean dressing using gauze sponges, not tissues or cotton balls. Do not scrub or use excessive force. Pat dry using gauze sponges, not tissue or cotton balls. Cleanser: Soap and Water 1 x Per Day/15 Days Discharge Instructions: Gently cleanse wound with antibacterial soap, rinse and pat dry prior to dressing wounds Cleanser: Wound Cleanser 1 x Per Day/15 Days Discharge Instructions: Wash your hands with soap and water. Remove old dressing, discard into plastic bag and place into trash. Cleanse the wound with Wound Cleanser prior to applying a clean dressing using gauze sponges, not tissues or cotton balls. Do not scrub or use excessive force. Pat dry  using gauze sponges, not tissue or cotton balls. Primary Dressing: Hydrofera Blue Ready Transfer Foam, 4x5 (in/in) 1 x Per Day/15 Days Discharge Instructions: Apply Hydrofera Blue Ready to wound bed as directed Compression Wrap: Profore Lite LF 3 Multilayer Compression Bandaging System 1 x Per Day/15 Days Discharge Instructions: Apply 3 multi-layer wrap as prescribed. 1. In office sharp debridement 2. Hydrofera Blue 3. 3 layer compression 4. Follow-up in 1 week Electronic Signature(s) Signed: 02/23/2021 12:02:46 PM By: Kalman Shan DO Entered By: Kalman Shan on 02/23/2021 12:01:15 Paul Hanna ([id number]) Paul Hanna, Paul Hanna ([id number]) -------------------------------------------------------------------------------- SuperBill Details Patient Name: Paul Hanna Date of Service: 02/23/2021 Medical Record Number: [id number] Patient Account Number: [id number] Date of Birth/Sex: 16-Mar-1980 (41 y.o. 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

## 2021-02-28 ENCOUNTER — Encounter: Payer: BC Managed Care – PPO | Admitting: Nurse Practitioner

## 2021-03-02 ENCOUNTER — Other Ambulatory Visit: Payer: Self-pay

## 2021-03-02 ENCOUNTER — Encounter: Payer: BC Managed Care – PPO | Attending: Internal Medicine | Admitting: Internal Medicine

## 2021-03-02 DIAGNOSIS — L97822 Non-pressure chronic ulcer of other part of left lower leg with fat layer exposed: Secondary | ICD-10-CM | POA: Insufficient documentation

## 2021-03-02 DIAGNOSIS — T798XXA Other early complications of trauma, initial encounter: Secondary | ICD-10-CM | POA: Diagnosis not present

## 2021-03-02 DIAGNOSIS — X58XXXA Exposure to other specified factors, initial encounter: Secondary | ICD-10-CM | POA: Diagnosis not present

## 2021-03-02 DIAGNOSIS — I87312 Chronic venous hypertension (idiopathic) with ulcer of left lower extremity: Secondary | ICD-10-CM | POA: Diagnosis not present

## 2021-03-02 NOTE — Progress Notes (Signed)
Paul Hanna, Paul Hanna ([id number]) Visit Report for 03/02/2021 Arrival Information Details Patient Name: Paul Hanna, Paul Hanna Date of Service: 03/02/2021 8:30 AM Medical Record Number: [id number] Patient Account Number: [id number] Date of Birth/Sex: 02/20/1980 (41 y.o. M) Treating RN: Donnamarie Poag Primary Care Ladarryl Wrage: Leretha Pol Other Clinician: Referring Willim Turnage: Leretha Pol Treating [place] Rosamond/Extender: Yaakov Guthrie in Treatment: 3 Visit Information History Since Last Visit Added or deleted any medications: No Patient Arrived: Ambulatory Had a fall or experienced change in No Arrival Time: 08:31 activities of daily living that may affect Accompanied By: self risk of falls: Transfer Assistance: None Hospitalized since last visit: No Patient Identification Verified: Yes Has Dressing in Place as Prescribed: Yes Secondary Verification Process Completed: Yes Has Compression in Place as Prescribed: Yes Patient Requires Transmission-Based Precautions: No Pain Present Now: No Patient Has Alerts: Yes Patient Alerts: NOT Diabetic Electronic Signature(s) Signed: 03/02/2021 3:05:53 PM By: Donnamarie Poag Entered By: Donnamarie Poag on 03/02/2021 08:35:49 Paul Hanna ([id number]) -------------------------------------------------------------------------------- Compression Therapy Details Patient Name: Paul Hanna Date of Service: 03/02/2021 8:30 AM Medical Record Number: [id number] Patient Account Number: [id number] Date of Birth/Sex: 11/14/1980 (41 y.o. M) Treating RN: Donnamarie Poag Primary Care Ronnald Shedden: Leretha Pol Other Clinician: Referring Adamae Ricklefs: Leretha Pol Treating Wendel Homeyer/Extender: Yaakov Guthrie in Treatment: 3 Compression Therapy Performed for Wound Assessment: Wound #1 Left Lower Leg Performed By: Clinician Donnamarie Poag, RN Compression Type: Three Layer Post Procedure Diagnosis Same as Pre-procedure Electronic Signature(s) Signed: 03/02/2021 3:05:53 PM By: Donnamarie Poag Entered By: Donnamarie Poag on 03/02/2021 08:47:38 Paul Hanna ([id number]) -------------------------------------------------------------------------------- Encounter Discharge Information Details Patient Name: Paul Hanna Date of Service: 03/02/2021 8:30 AM Medical Record Number: [id number] Patient Account Number: [id number] Date of Birth/Sex: 1980/02/07 (41 y.o. M) Treating RN: Donnamarie Poag Primary Care Britania Shreeve: Leretha Pol Other Clinician: Referring Giancarlos Berendt: Leretha Pol Treating Temesha Queener/Extender: Yaakov Guthrie in Treatment: 3 Encounter Discharge Information Items Post Procedure Vitals Discharge Condition: Stable Temperature (F): 98.4 Ambulatory Status: Ambulatory Pulse (bpm): 83 Discharge Destination: Home Respiratory Rate (breaths/min): 16 Transportation: Private Auto Blood Pressure (mmHg): 139/88 Accompanied By: self Schedule Follow-up Appointment: Yes Clinical Summary of Care: Electronic Signature(s) Signed: 03/02/2021 3:05:53 PM By: Donnamarie Poag Entered By: Donnamarie Poag on 03/02/2021 09:03:39 Paul Hanna ([id number]) -------------------------------------------------------------------------------- Lower Extremity Assessment Details Patient Name: Paul Hanna Date of Service: 03/02/2021 8:30 AM Medical Record Number: [id number] Patient Account Number: [id number] Date of Birth/Sex: [birth year] (41 y.o. M) Treating RN: Donnamarie Poag Primary Care Keondra Haydu: Leretha Pol Other Clinician: Referring Rosell Khouri: Leretha Pol Treating Pasqualina Colasurdo/Extender: Yaakov Guthrie in Treatment: 3 Edema Assessment Assessed: [Left: Yes] [Right: No] [Left: Edema] [Right: :] Calf Left: Right: Point of Measurement: 31 cm From Medial Instep 46 cm Ankle Left: Right: Point of Measurement: 10 cm From Medial Instep 26 cm Vascular Assessment Pulses: Dorsalis Pedis Palpable: [Left:Yes] Electronic Signature(s) Signed: 03/02/2021 3:05:53 PM By: Donnamarie Poag Entered By:  Donnamarie Poag on 03/02/2021 08:46:07 Paul Hanna ([id number]) -------------------------------------------------------------------------------- Multi Wound Chart Details Patient Name: Paul Hanna Date of Service: 03/02/2021 8:30 AM Medical Record Number: [id number] Patient Account Number: [id number] Date of Birth/Sex: [birth year] (41 y.o. M) Treating RN: Donnamarie Poag Primary Care Josilyn Shippee: Leretha Pol Other Clinician: Referring Julen Rubert: Leretha Pol Treating Aravind Chrismer/Extender: Yaakov Guthrie in Treatment: 3 Vital Signs Height(in): 71 Pulse(bpm): 72 Weight(lbs): 300 Blood Pressure(mmHg): 139/88 Body Mass Index(BMI): 41.8 Temperature(F): 98.4 Respiratory Rate(breaths/min): 16 Photos: [1:No Photos] [N/A:N/A] Wound Location: [1:Left Lower Leg] [N/A:N/A] Wounding Event: [1:Skin Tear/Laceration] [N/A:N/A] Primary Etiology: [1:Trauma, Other] [N/A:N/A] Date Acquired: [1:11/01/2020] [N/A:N/A] Weeks of Treatment: [1:3] [N/A:N/A] Wound Status: [  1:Open] [N/A:N/A] Wound Recurrence: [1:No] [N/A:N/A] Measurements L x W x D (cm) [1:1.7x1.1x0.1] [N/A:N/A] Area (cm) : [1:1.469] [N/A:N/A] Volume (cm) : [1:0.147] [N/A:N/A] % Reduction in Area: [1:40.60%] [N/A:N/A] % Reduction in Volume: [1:40.50%] [N/A:N/A] Classification: [1:Full Thickness Without Exposed Support Structures] [N/A:N/A] Exudate Amount: [1:Medium] [N/A:N/A] Exudate Type: [1:Serosanguineous] [N/A:N/A] Exudate Color: [1:red, brown] [N/A:N/A] Granulation Amount: [1:Large (67-100%)] [N/A:N/A] Granulation Quality: [1:Red, Hyper-granulation] [N/A:N/A] Necrotic Amount: [1:Small (1-33%)] [N/A:N/A] Exposed Structures: [1:Fat Layer (Subcutaneous Tissue): Yes Fascia: No Tendon: No Muscle: No Joint: No Bone: No None] [N/A:N/A N/A] Treatment Notes Electronic Signature(s) Signed: 03/02/2021 3:05:53 PM By: Donnamarie Poag Entered By: Donnamarie Poag on 03/02/2021 08:46:24 Paul Hanna  ([id number]) -------------------------------------------------------------------------------- [place] Details Patient Name: Paul Hanna Date of Service: 03/02/2021 8:30 AM Medical Record Number: [id number] Patient Account Number: [id number] Date of Birth/Sex: March 25, 1980 (41 y.o. M) Treating RN: Donnamarie Poag Primary Care Provider: Leretha Pol Other Clinician: Referring Provider: Leretha Pol Treating Provider/Extender: Yaakov Guthrie in Treatment: 3 Active Inactive Wound/Skin Impairment Nursing Diagnoses: Impaired tissue integrity Knowledge deficit related to smoking impact on wound healing Knowledge deficit related to ulceration/compromised skin integrity Goals: Patient/caregiver will verbalize understanding of skin care regimen Date Initiated: 02/09/2021 Date Inactivated: 02/23/2021 Target Resolution Date: 02/25/2021 Goal Status: Met Ulcer/skin breakdown will have a volume reduction of 30% by week 4 Date Initiated: 02/09/2021 Target Resolution Date: 03/09/2021 Goal Status: Active Ulcer/skin breakdown will have a volume reduction of 50% by week 8 Date Initiated: 02/09/2021 Target Resolution Date: 04/06/2021 Goal Status: Active Ulcer/skin breakdown will have a volume reduction of 80% by week 12 Date Initiated: 02/09/2021 Target Resolution Date: 05/04/2021 Goal Status: Active Ulcer/skin breakdown will heal within 14 weeks Date Initiated: 02/09/2021 Target Resolution Date: 05/18/2021 Goal Status: Active Interventions: Assess patient/caregiver ability to obtain necessary supplies Assess patient/caregiver ability to perform ulcer/skin care regimen upon admission and as needed Assess ulceration(s) every visit Notes: Electronic Signature(s) Signed: 03/02/2021 3:05:53 PM By: Donnamarie Poag Entered By: Donnamarie Poag on 03/02/2021 08:46:16 Paul Hanna ([id number]) -------------------------------------------------------------------------------- Pain Assessment  Details Patient Name: Paul Hanna Date of Service: 03/02/2021 8:30 AM Medical Record Number: [id number] Patient Account Number: [id number] Date of Birth/Sex: 15-Feb-1980 (41 y.o. M) Treating RN: Donnamarie Poag Primary Care Provider: Leretha Pol Other Clinician: Referring Provider: Leretha Pol Treating Provider/Extender: Yaakov Guthrie in Treatment: 3 Active Problems Location of Pain Severity and Description of Pain Patient Has Paino No Site Locations Rate the pain. Current Pain Level: 0 Pain Management and Medication Current Pain Management: Electronic Signature(s) Signed: 03/02/2021 3:05:53 PM By: Donnamarie Poag Entered By: Donnamarie Poag on 03/02/2021 08:36:25 Paul Hanna ([id number]) -------------------------------------------------------------------------------- Patient/Caregiver Education Details Patient Name: Paul Hanna Date of Service: 03/02/2021 8:30 AM Medical Record Number: [id number] Patient Account Number: [id number] Date of Birth/Gender: 28-Apr-1980 (41 y.o. M) Treating RN: Donnamarie Poag Primary Care Physician: Leretha Pol Other Clinician: Referring Physician: Leretha Pol Treating Physician/Extender: Yaakov Guthrie in Treatment: 3 Education Assessment Education Provided To: Patient Education Topics Provided Wound/Skin Impairment: Electronic Signature(s) Signed: 03/02/2021 3:05:53 PM By: Donnamarie Poag Entered By: Donnamarie Poag on 03/02/2021 08:55:45 Paul Hanna ([id number]) -------------------------------------------------------------------------------- Wound Assessment Details Patient Name: Paul Hanna Date of Service: 03/02/2021 8:30 AM Medical Record Number: [id number] Patient Account Number: [id number] Date of Birth/Sex: 12-Mar-1980 (41 y.o. M) Treating RN: Donnamarie Poag Primary Care Provider: Leretha Pol Other Clinician: Referring Provider: Leretha Pol Treating Provider/Extender: Yaakov Guthrie in Treatment: 3 Wound Status Wound  Number: 1 Primary Etiology: Trauma, Other Wound Location: Left Lower Leg Wound Status: Open Wounding Event: Skin  Tear/Laceration Date Acquired: 11/01/2020 Weeks Of Treatment: 3 Clustered Wound: No Wound Measurements Length: (cm) 1.7 Width: (cm) 1.1 Depth: (cm) 0.1 Area: (cm) 1.469 Volume: (cm) 0.147 % Reduction in Area: 40.6% % Reduction in Volume: 40.5% Epithelialization: None Tunneling: No Undermining: No Wound Description Classification: Full Thickness Without Exposed Support Structures Exudate Amount: Medium Exudate Type: Serosanguineous Exudate Color: red, brown Foul Odor After Cleansing: No Slough/Fibrino Yes Wound Bed Granulation Amount: Large (67-100%) Exposed Structure Granulation Quality: Red, Hyper-granulation Fascia Exposed: No Necrotic Amount: Small (1-33%) Fat Layer (Subcutaneous Tissue) Exposed: Yes Necrotic Quality: Adherent Slough Tendon Exposed: No Muscle Exposed: No Joint Exposed: No Bone Exposed: No Treatment Notes Wound #1 (Lower Leg) Wound Laterality: Left Cleanser Normal Saline Discharge Instruction: Wash your hands with soap and water. Remove old dressing, discard into plastic bag and place into trash. Cleanse the wound with Normal Saline prior to applying a clean dressing using gauze sponges, not tissues or cotton balls. Do not scrub or use excessive force. Pat dry using gauze sponges, not tissue or cotton balls. Soap and Water Discharge Instruction: Gently cleanse wound with antibacterial soap, rinse and pat dry prior to dressing wounds Wound Cleanser Discharge Instruction: Wash your hands with soap and water. Remove old dressing, discard into plastic bag and place into trash. Cleanse the wound with Wound Cleanser prior to applying a clean dressing using gauze sponges, not tissues or cotton balls. Do not scrub or use excessive force. Pat dry using gauze sponges, not tissue or cotton balls. Peri-Wound Care Topical Primary  Dressing Hydrofera Blue Ready Transfer Foam, 4x5 (in/in) Discharge Instruction: Apply Hydrofera Blue Ready to wound bed as directed Secondary Dressing Paul Hanna, Paul Hanna ([id number]) Gauze Secured With Compression Wrap Profore Lite LF 3 Multilayer Compression Bandaging System Discharge Instruction: Apply 3 multi-layer wrap as prescribed. Compression Stockings Add-Ons Electronic Signature(s) Signed: 03/02/2021 3:05:53 PM By: Donnamarie Poag Entered By: Donnamarie Poag on 03/02/2021 08:44:30 Paul Hanna ([id number]) -------------------------------------------------------------------------------- [place] Details Patient Name: Paul Hanna Date of Service: 03/02/2021 8:30 AM Medical Record Number: [id number] Patient Account Number: [id number] Date of Birth/Sex: 1980/07/04 (41 y.o. M) Treating RN: Donnamarie Poag Primary Care Provider: Leretha Pol Other Clinician: Referring Provider: Leretha Pol Treating Provider/Extender: Yaakov Guthrie in Treatment: 3 Vital Signs Time Taken: 08:35 Temperature (F): 98.4 Height (in): 71 Pulse (bpm): 83 Weight (lbs): 300 Respiratory Rate (breaths/min): 16 Body Mass Index (BMI): 41.8 Blood Pressure (mmHg): 139/88 Reference Range: 80 - 120 mg / dl Electronic Signature(s) Signed: 03/02/2021 3:05:53 PM By: Donnamarie Poag Entered ByDonnamarie Poag on 03/02/2021 30:09:23

## 2021-03-02 NOTE — Progress Notes (Signed)
Paul Hanna, Paul Hanna ([id number]) Visit Report for 03/02/2021 Chief Complaint Document Details Patient Name: Paul Hanna, Paul Hanna Date of Service: 03/02/2021 8:30 AM Medical Record Number: [id number] Patient Account Number: [id number] Date of Birth/Sex: 02/15/1980 (41 y.o. M) Treating RN: Paul FeinsteinBishop, Paul Primary Care Provider: Vincent GrosBoscia, Hanna Other Clinician: Referring Provider: Vincent GrosBoscia, Hanna Treating Provider/Extender: Paul Hanna: 3 Information Obtained from: Patient Chief Complaint Left lower extremity wound due to trauma Electronic Signature(s) Signed: 03/02/2021 9:08:29 AM By: Paul Hanna Entered By: Paul Hanna on 03/02/2021 09:05:47 Paul Hanna, Paul Hanna ([id number]) -------------------------------------------------------------------------------- Debridement Details Patient Name: Paul Hanna, Paul Hanna Date of Service: 03/02/2021 8:30 AM Medical Record Number: [id number] Patient Account Number: [id number] Date of Birth/Sex: 01/21/1980 (41 y.o. M) Treating RN: Paul FeinsteinBishop, Paul Primary Care Provider: Vincent GrosBoscia, Hanna Other Clinician: Referring Provider: Vincent GrosBoscia, Hanna Treating Provider/Extender: Paul Hanna Weeks in Hanna: 3 Debridement Performed for Wound #1 Left Lower Leg Assessment: Performed By: Physician Paul Hanna Debridement Type: Debridement Level of Consciousness (Pre- Awake and Alert procedure): Pre-procedure Verification/Time Out Yes - 08:51 Taken: Start Time: 08:52 Pain Control: Lidocaine Total Area Debrided (L x W): 1.7 (cm) x 1.1 (cm) = 1.87 (cm) Tissue and other material Viable, Non-Viable, Subcutaneous debrided: Level: Skin/Subcutaneous Tissue Debridement Description: Excisional Instrument: Curette Bleeding: Minimum Hemostasis Achieved: Silver Nitrate Response to Hanna: Procedure was tolerated well Level of Consciousness (Post- Awake and Alert procedure): Post Debridement Measurements of Total Wound Length: (cm) 1.7 Width: (cm)  1.1 Depth: (cm) 0.1 Volume: (cm) 0.147 Character of Wound/Ulcer Post Debridement: Improved Post Procedure Diagnosis Same as Pre-procedure Notes 1 stick silver nitrate used for hypergranulation tissue Electronic Signature(s) Signed: 03/02/2021 9:08:29 AM By: Paul CorwinHoffman, Paul [place] Hanna Signed: 03/02/2021 3:05:53 PM By: Paul FeinsteinBishop, Paul Entered By: Paul FeinsteinBishop, Paul on 03/02/2021 08:55:00 Paul Hanna, Paul Hanna ([id number]) -------------------------------------------------------------------------------- HPI Details Patient Name: Paul Hanna, Paul Hanna Date of Service: 03/02/2021 8:30 AM Medical Record Number: [id number] Patient Account Number: [id number] Date of Birth/Sex: 11/12/1980 (41 y.o. M) Treating RN: Paul FeinsteinBishop, Paul Primary Care Provider: Vincent GrosBoscia, Hanna Other Clinician: Referring Provider: Vincent GrosBoscia, Hanna Treating Provider/Extender: Paul Hanna, Paul Hanna Weeks in Hanna: 3 History of Present Illness HPI Description: Admission 02/09/2021 Mr. Paul Hanna is a 41 year old male otherwise healthy that presents to the clinic for an abrasion to the left leg caused by a cement wall on Halloween night. He originally took care of the wound and use Neosporin and kept the area clean however it has not healed. He has chronic pain to the area. He visited the ED on 01/18/2021 for this issue and they treated him with doxycycline and mupirocin ointment. He completed Hanna and followed up with his family medicine office Who started him on ciprofloxacin. He also completed this course. He reports no improvement to the wound site after these courses of antibiotics. He denies ever having an x-ray of the area. 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 2/15; Patient present for Follow-up. He has been using gentamicin ointment with Hydrofera Blue daily. He has no issues or complaints today. He denies signs of infection. 2/22; patient presents for follow-up. He tolerated the compression wrap well. He has no issues or  complaints today. He denies signs of infection. He reports improvement in chronic pain to the wound site. 3/1; patient presents for follow-up. He has no issues or complaints today. He denies signs of infection. He continues to report improvement in chronic pain to the wound site. Electronic Signature(s) Signed: 03/02/2021 9:08:29 AM By: Paul CorwinHoffman, Paul Golda Hanna Entered By: Paul CorwinHoffman, Paul Hanna on 03/02/2021 09:06:15  Paul Hanna ([id number]) -------------------------------------------------------------------------------- Physical Exam Details Patient Name: Paul Hanna, Paul Hanna Date of Service: 03/02/2021 8:30 AM Medical Record Number: [id number] Patient Account Number: [id number] Date of Birth/Sex: 1980/01/08 (41 y.o. M) Treating RN: Paul Hanna Primary Care Provider: Vincent Hanna Other Clinician: Referring Provider: Vincent Hanna Treating Provider/Extender: Paul Franco in Hanna: 3 Constitutional . Cardiovascular . Psychiatric . Notes Left lower extremity: To the anterior aspect there is an open wound with granulation tissue, Hyper granulated tissue and nonviable tissue present. No surrounding signs of infection. Varicose veins noted Electronic Signature(s) Signed: 03/02/2021 9:08:29 AM By: Paul Corwin Hanna Entered By: Paul Corwin on 03/02/2021 09:06:39 Paul Hanna ([id number]) -------------------------------------------------------------------------------- Physician Orders Details Patient Name: Paul Hanna Date of Service: 03/02/2021 8:30 AM Medical Record Number: [id number] Patient Account Number: [id number] Date of Birth/Sex: 02-06-1980 (41 y.o. M) Treating RN: Paul Hanna Primary Care Provider: Vincent Hanna Other Clinician: Referring Provider: Vincent Hanna Treating Provider/Extender: Paul Franco in Hanna: 3 Verbal / Phone Orders: No Diagnosis Coding Follow-up Appointments o Return Appointment in 1 week. o Nurse Visit as needed Bathing/  Shower/ Hygiene o May shower with wound dressing protected with water repellent cover or cast protector. o No tub bath. Anesthetic (Use 'Patient Medications' Section for Anesthetic Order Entry) o Lidocaine applied to wound bed Edema Control - Lymphedema / Segmental Compressive Device / Other o Optional: One layer of unna paste to top of compression wrap (to act as an anchor). o Patient to wear own compression stockings. Remove compression stockings every night before going to bed and put on every morning when getting up. o Elevate leg(s) parallel to the floor when sitting. Additional Orders / Instructions o Follow Nutritious Diet and Increase Protein Intake Medications-Please add to medication list. o Take one 500mg  Tylenol (Acetaminophen) and one 200mg  Motrin (Ibuprofen) every 6 hours for pain. Hanna not take ibuprofen if you are on blood thinners or have stomach ulcers. Wound Hanna Wound #1 - Lower Leg Wound Laterality: Left Cleanser: Normal Saline 1 x Per Week/15 Days Discharge Instructions: Wash your hands with soap and water. Remove old dressing, discard into plastic bag and place into trash. Cleanse the wound with Normal Saline prior to applying a clean dressing using gauze sponges, not tissues or cotton balls. Hanna not scrub or use excessive force. Pat dry using gauze sponges, not tissue or cotton balls. Cleanser: Soap and Water 1 x Per Week/15 Days Discharge Instructions: Gently cleanse wound with antibacterial soap, rinse and pat dry prior to dressing wounds Cleanser: Wound Cleanser 1 x Per Week/15 Days Discharge Instructions: Wash your hands with soap and water. Remove old dressing, discard into plastic bag and place into trash. Cleanse the wound with Wound Cleanser prior to applying a clean dressing using gauze sponges, not tissues or cotton balls. Hanna not scrub or use excessive force. Pat dry using gauze sponges, not tissue or cotton balls. Primary Dressing:  Hydrofera Blue Ready Transfer Foam, 4x5 (in/in) 1 x Per Week/15 Days Discharge Instructions: Apply Hydrofera Blue Ready to wound bed as directed Secondary Dressing: Gauze 1 x Per Week/15 Days Compression Wrap: Profore Lite LF 3 Multilayer Compression Bandaging System 1 x Per Week/15 Days Discharge Instructions: Apply 3 multi-layer wrap as prescribed. Electronic Signature(s) Signed: 03/02/2021 9:08:29 AM By: Paul Corwin Hanna Entered By: Paul Corwin on 03/02/2021 09:08:02 Paul Hanna ([id number]) -------------------------------------------------------------------------------- Problem List Details Patient Name: Paul Hanna Date of Service: 03/02/2021 8:30 AM Medical Record Number: [id number] Patient Account Number: [id number] Date of Birth/Sex: [birth year] (41 y.o. M) Treating  RN: Paul FeinsteinBishop, Paul Primary Care Provider: Vincent GrosBoscia, Hanna Other Clinician: Referring Provider: Vincent GrosBoscia, Hanna Treating Provider/Extender: Paul Hanna, Nekeisha Aure Weeks in Hanna: 3 Active Problems ICD-10 Encounter Code Description Active Date MDM Diagnosis [phone] Non-pressure chronic ulcer of other part of left lower leg with fat layer 02/09/2021 No Yes exposed T79.8XXA Other early complications of trauma, initial encounter 02/09/2021 No Yes I87.312 Chronic venous hypertension (idiopathic) with ulcer of left lower 02/23/2021 No Yes extremity Inactive Problems Resolved Problems Electronic Signature(s) Signed: 03/02/2021 9:08:29 AM By: Paul CorwinHoffman, Geniece Akers Hanna Entered By: Paul CorwinHoffman, Dru Laurel on 03/02/2021 09:05:43 Paul Hanna, Daniele ([id number]) -------------------------------------------------------------------------------- Progress Note Details Patient Name: Paul Hanna, Jiovanni Date of Service: 03/02/2021 8:30 AM Medical Record Number: [id number] Patient Account Number: [id number] Date of Birth/Sex: 01/18/1980 (41 y.o. M) Treating RN: Paul FeinsteinBishop, Paul Primary Care Provider: Vincent GrosBoscia, Hanna Other Clinician: Referring Provider: Vincent GrosBoscia,  Hanna Treating Provider/Extender: Paul Hanna, Moncia Annas Weeks in Hanna: 3 Subjective Chief Complaint Information obtained from Patient Left lower extremity wound due to trauma History of Present Illness (HPI) Admission 02/09/2021 Mr. Paul RicherJustin Cohick is a 41 year old male otherwise healthy that presents to the clinic for an abrasion to the left leg caused by a cement wall on Halloween night. He originally took care of the wound and use Neosporin and kept the area clean however it has not healed. He has chronic pain to the area. He visited the ED on 01/18/2021 for this issue and they treated him with doxycycline and mupirocin ointment. He completed Hanna and followed up with his family medicine office Who started him on ciprofloxacin. He also completed this course. He reports no improvement to the wound site after these courses of antibiotics. He denies ever having an x-ray of the area. 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 2/15; Patient present for Follow-up. He has been using gentamicin ointment with Hydrofera Blue daily. He has no issues or complaints today. He denies signs of infection. 2/22; patient presents for follow-up. He tolerated the compression wrap well. He has no issues or complaints today. He denies signs of infection. He reports improvement in chronic pain to the wound site. 3/1; patient presents for follow-up. He has no issues or complaints today. He denies signs of infection. He continues to report improvement in chronic pain to the wound site. Objective Constitutional Vitals Time Taken: 8:35 AM, Height: 71 in, Weight: 300 lbs, BMI: 41.8, Temperature: 98.4 F, Pulse: 83 bpm, Respiratory Rate: 16 breaths/min, Blood Pressure: 139/88 mmHg. General Notes: Left lower extremity: To the anterior aspect there is an open wound with granulation tissue, Hyper granulated tissue and nonviable tissue present. No surrounding signs of infection. Varicose  veins noted Integumentary (Hair, Skin) Wound #1 status is Open. Original cause of wound was Skin Tear/Laceration. The date acquired was: 11/01/2020. The wound has been in Hanna 3 weeks. The wound is located on the Left Lower Leg. The wound measures 1.7cm length x 1.1cm width x 0.1cm depth; 1.469cm^2 area and 0.147cm^3 volume. There is Fat Layer (Subcutaneous Tissue) exposed. There is no tunneling or undermining noted. There is a medium amount of serosanguineous drainage noted. There is large (67-100%) red, hyper - granulation within the wound bed. There is a small (1-33%) amount of necrotic tissue within the wound bed including Adherent Slough. Assessment Active Problems ICD-10 Non-pressure chronic ulcer of other part of left lower leg with fat layer exposed Other early complications of trauma, initial encounter Chronic venous hypertension (idiopathic) with ulcer of left lower extremity Stones, Damian ([id number])  Patient's wound has shown improvement in size in appearance since last clinic visit. I debrided nonviable tissue and used silver nitrate to the hyper granulated areas. No signs of surrounding infection. I recommended continue with current therapy of Hydrofera Blue under 3 layer compression. Follow-up in 1 week. Procedures Wound #1 Pre-procedure diagnosis of Wound #1 is a Trauma, Other located on the Left Lower Leg . There was a Excisional Skin/Subcutaneous Tissue Debridement with a total area of 1.87 sq cm performed by Paul Corwin, Hanna. With the following instrument(s): Curette to remove Viable and Non-Viable tissue/material. Material removed includes Subcutaneous Tissue after achieving pain control using Lidocaine. A time out was conducted at 08:51, prior to the start of the procedure. A Minimum amount of bleeding was controlled with Silver Nitrate. The procedure was tolerated well. Post Debridement Measurements: 1.7cm length x 1.1cm width x 0.1cm depth; 0.147cm^3  volume. Character of Wound/Ulcer Post Debridement is improved. Post procedure Diagnosis Wound #1: Same as Pre-Procedure General Notes: 1 stick silver nitrate used for hypergranulation tissue. Pre-procedure diagnosis of Wound #1 is a Trauma, Other located on the Left Lower Leg . There was a Three Layer Compression Therapy Procedure by Paul Feinstein, RN. Post procedure Diagnosis Wound #1: Same as Pre-Procedure Plan Follow-up Appointments: Return Appointment in 1 week. Nurse Visit as needed Bathing/ Shower/ Hygiene: May shower with wound dressing protected with water repellent cover or cast protector. No tub bath. Anesthetic (Use 'Patient Medications' Section for Anesthetic Order Entry): Lidocaine applied to wound bed Edema Control - Lymphedema / Segmental Compressive Device / Other: Optional: One layer of unna paste to top of compression wrap (to act as an anchor). Patient to wear own compression stockings. Remove compression stockings every night before going to bed and put on every morning when getting up. Elevate leg(s) parallel to the floor when sitting. Additional Orders / Instructions: Follow Nutritious Diet and Increase Protein Intake Medications-Please add to medication list.: Take one 500mg  Tylenol (Acetaminophen) and one 200mg  Motrin (Ibuprofen) every 6 hours for pain. Hanna not take ibuprofen if you are on blood thinners or have stomach ulcers. WOUND #1: - Lower Leg Wound Laterality: Left Cleanser: Normal Saline 1 x Per Week/15 Days Discharge Instructions: Wash your hands with soap and water. Remove old dressing, discard into plastic bag and place into trash. Cleanse the wound with Normal Saline prior to applying a clean dressing using gauze sponges, not tissues or cotton balls. Hanna not scrub or use excessive force. Pat dry using gauze sponges, not tissue or cotton balls. Cleanser: Soap and Water 1 x Per Week/15 Days Discharge Instructions: Gently cleanse wound with antibacterial  soap, rinse and pat dry prior to dressing wounds Cleanser: Wound Cleanser 1 x Per Week/15 Days Discharge Instructions: Wash your hands with soap and water. Remove old dressing, discard into plastic bag and place into trash. Cleanse the wound with Wound Cleanser prior to applying a clean dressing using gauze sponges, not tissues or cotton balls. Hanna not scrub or use excessive force. Pat dry using gauze sponges, not tissue or cotton balls. Primary Dressing: Hydrofera Blue Ready Transfer Foam, 4x5 (in/in) 1 x Per Week/15 Days Discharge Instructions: Apply Hydrofera Blue Ready to wound bed as directed Secondary Dressing: Gauze 1 x Per Week/15 Days Compression Wrap: Profore Lite LF 3 Multilayer Compression Bandaging System 1 x Per Week/15 Days Discharge Instructions: Apply 3 multi-layer wrap as prescribed. 1. In office sharp debridement 2. Hydrofera Blue under 3 layer compression 3. Follow-up in 1 week Electronic Signature(s) Machi,  Donyale ([id number]) Signed: 03/02/2021 9:08:29 AM By: Paul Corwin Hanna Entered By: Paul Corwin on 03/02/2021 09:07:35 Paul Hanna ([id number]) -------------------------------------------------------------------------------- SuperBill Details Patient Name: Paul Hanna Date of Service: 03/02/2021 Medical Record Number: [id number] Patient Account Number: [id number] Date of Birth/Sex: 1980-05-02 (41 y.o. 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

## 2021-03-09 ENCOUNTER — Encounter (HOSPITAL_BASED_OUTPATIENT_CLINIC_OR_DEPARTMENT_OTHER): Payer: BC Managed Care – PPO | Admitting: Internal Medicine

## 2021-03-09 ENCOUNTER — Other Ambulatory Visit: Payer: Self-pay

## 2021-03-09 DIAGNOSIS — L97822 Non-pressure chronic ulcer of other part of left lower leg with fat layer exposed: Secondary | ICD-10-CM

## 2021-03-09 NOTE — Progress Notes (Signed)
MEDFORD, STAHELI ([id number]) Visit Report for 03/09/2021 Chief Complaint Document Details Patient Name: Paul Hanna, Paul Hanna Date of Service: 03/09/2021 8:30 AM Medical Record Number: [id number] Patient Account Number: [id number] Date of Birth/Sex: 1980-02-04 (40 y.o. M) Treating RN: Hansel Feinstein Primary Care Provider: Vincent Gros Other Clinician: Referring Provider: Vincent Gros Treating Provider/Extender: Tilda Franco in Treatment: 4 Information Obtained from: Patient Chief Complaint Left lower extremity wound due to trauma Electronic Signature(s) Signed: 03/09/2021 9:19:47 AM By: Geralyn Corwin DO Entered By: Geralyn Corwin on 03/09/2021 09:16:22 Paul Hanna ([id number]) -------------------------------------------------------------------------------- Debridement Details Patient Name: Paul Hanna Date of Service: 03/09/2021 8:30 AM Medical Record Number: [id number] Patient Account Number: [id number] Date of Birth/Sex: [birth year] (40 y.o. M) Treating RN: Hansel Feinstein Primary Care Provider: Vincent Gros Other Clinician: Referring Provider: Vincent Gros Treating Provider/Extender: Tilda Franco in Treatment: 4 Debridement Performed for Wound #1 Left Lower Leg Assessment: Performed By: Physician Geralyn Corwin, MD Debridement Type: Debridement Level of Consciousness (Pre- Awake and Alert procedure): Pre-procedure Verification/Time Out Yes - 09:06 Taken: Start Time: 09:07 Pain Control: Lidocaine Total Area Debrided (L x W): 1.7 (cm) x 1 (cm) = 1.7 (cm) Tissue and other material Viable, Non-Viable, Slough, Subcutaneous, Slough debrided: Level: Skin/Subcutaneous Tissue Debridement Description: Excisional Instrument: Curette Bleeding: Moderate Hemostasis Achieved: Silver Nitrate Response to Treatment: Procedure was tolerated well Level of Consciousness (Post- Awake and Alert procedure): Post Debridement Measurements of Total Wound Length: (cm)  1.7 Width: (cm) 1 Depth: (cm) 0.1 Volume: (cm) 0.134 Character of Wound/Ulcer Post Debridement: Improved Post Procedure Diagnosis Same as Pre-procedure Notes 2 silver nitrate sticks used for hypergranulation tissue Electronic Signature(s) Signed: 03/09/2021 9:19:47 AM By: Geralyn Corwin DO Signed: 03/09/2021 12:55:43 PM By: Hansel Feinstein Entered By: Hansel Feinstein on 03/09/2021 09:09:01 Paul Hanna ([id number]) -------------------------------------------------------------------------------- HPI Details Patient Name: Paul Hanna Date of Service: 03/09/2021 8:30 AM Medical Record Number: [id number] Patient Account Number: [id number] Date of Birth/Sex: 05/07/1980 (40 y.o. M) Treating RN: Hansel Feinstein Primary Care Provider: Vincent Gros Other Clinician: Referring Provider: Vincent Gros Treating Provider/Extender: Tilda Franco in Treatment: 4 History of Present Illness HPI Description: Admission 02/09/2021 Paul Hanna is a 41 year old male otherwise healthy that presents to the clinic for an abrasion to the left leg caused by a cement wall on Halloween night. He originally took care of the wound and use Neosporin and kept the area clean however it has not healed. He has chronic pain to the area. He visited the ED on 01/18/2021 for this issue and they treated him with doxycycline and mupirocin ointment. He completed treatment and followed up with his family medicine office Who started him on ciprofloxacin. He also completed this course. He reports no improvement to the wound site after these courses of antibiotics. He denies ever having an x-ray of the area. 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 2/15; Patient present for Follow-up. He has been using gentamicin ointment with Hydrofera Blue daily. He has no issues or complaints today. He denies signs of infection. 2/22; patient presents for follow-up. He tolerated the compression wrap well. He has  no issues or complaints today. He denies signs of infection. He reports improvement in chronic pain to the wound site. 3/1; patient presents for follow-up. He has no issues or complaints today. He denies signs of infection. He continues to report improvement in chronic pain to the wound site. 3/8; patient presents for follow-up. He has tolerated the compression wrap well. He reports minimal  pain to the wound site. He denies signs of infection. Electronic Signature(s) Signed: 03/09/2021 9:19:47 AM By: Geralyn Corwin DO Entered By: Geralyn Corwin on 03/09/2021 09:16:51 Paul Hanna ([id number]) -------------------------------------------------------------------------------- Physical Exam Details Patient Name: Paul Hanna Date of Service: 03/09/2021 8:30 AM Medical Record Number: [id number] Patient Account Number: [id number] Date of Birth/Sex: 1980/04/15 (40 y.o. M) Treating RN: Hansel Feinstein Primary Care Provider: Vincent Gros Other Clinician: Referring Provider: Vincent Gros Treating Provider/Extender: Tilda Franco in Treatment: 4 Constitutional . Cardiovascular . Psychiatric . Notes Left lower extremity: To the anterior aspect there is an open wound with granulation tissue, Hyper granulated tissue and nonviable tissue present. No surrounding signs of infection. Varicose veins noted Electronic Signature(s) Signed: 03/09/2021 9:19:47 AM By: Geralyn Corwin DO Entered By: Geralyn Corwin on 03/09/2021 09:17:11 Paul Hanna ([id number]) -------------------------------------------------------------------------------- Physician Orders Details Patient Name: Paul Hanna Date of Service: 03/09/2021 8:30 AM Medical Record Number: [id number] Patient Account Number: [id number] Date of Birth/Sex: 11-03-1980 (40 y.o. M) Treating RN: Hansel Feinstein Primary Care Provider: Vincent Gros Other Clinician: Referring Provider: Vincent Gros Treating Provider/Extender: Tilda Franco in Treatment: 4 Verbal / Phone Orders: No Diagnosis Coding Follow-up Appointments o Return Appointment in 1 week. o Nurse Visit as needed Bathing/ Shower/ Hygiene o May shower with wound dressing protected with water repellent cover or cast protector. o No tub bath. Anesthetic (Use 'Patient Medications' Section for Anesthetic Order Entry) o Lidocaine applied to wound bed Edema Control - Lymphedema / Segmental Compressive Device / Other o Optional: One layer of unna paste to top of compression wrap (to act as an anchor). o Patient to wear own compression stockings. Remove compression stockings every night before going to bed and put on every morning when getting up. o Elevate leg(s) parallel to the floor when sitting. Additional Orders / Instructions o Follow Nutritious Diet and Increase Protein Intake Medications-Please add to medication list. o Take one  Tylenol (Acetaminophen) and one  Motrin (Ibuprofen) every 6 hours for pain. Do not take ibuprofen if you are on blood thinners or have stomach ulcers. Wound Treatment Wound #1 - Lower Leg Wound Laterality: Left Cleanser: Normal Saline 1 x Per Week/15 Days Discharge Instructions: Wash your hands with soap and water. Remove old dressing, discard into plastic bag and place into trash. Cleanse the wound with Normal Saline prior to applying a clean dressing using gauze sponges, not tissues or cotton balls. Do not scrub or use excessive force. Pat dry using gauze sponges, not tissue or cotton balls. Cleanser: Soap and Water 1 x Per Week/15 Days Discharge Instructions: Gently cleanse wound with antibacterial soap, rinse and pat dry prior to dressing wounds Cleanser: Wound Cleanser 1 x Per Week/15 Days Discharge Instructions: Wash your hands with soap and water. Remove old dressing, discard into plastic bag and place into trash. Cleanse the wound with Wound Cleanser prior to applying a clean  dressing using gauze sponges, not tissues or cotton balls. Do not scrub or use excessive force. Pat dry using gauze sponges, not tissue or cotton balls. Primary Dressing: Hydrofera Blue Ready Transfer Foam, 4x5 (in/in) 1 x Per Week/15 Days Discharge Instructions: Apply Hydrofera Blue Ready to wound bed as directed Secondary Dressing: Gauze 1 x Per Week/15 Days Compression Wrap: 3-LAYER WRAP - Profore Lite LF 3 Multilayer Compression Bandaging System 1 x Per Week/15 Days Discharge Instructions: Apply 3 multi-layer wrap as prescribed. Electronic Signature(s) Signed: 03/09/2021 9:19:47 AM By: Geralyn Corwin DO Entered By: Geralyn Corwin on 03/09/2021 09:19:01 Paul Hanna ([id number]) --------------------------------------------------------------------------------  Problem List Details Patient Name: JOHNTE, PORTNOY Date of Service: 03/09/2021 8:30 AM Medical Record Number: [id number] Patient Account Number: [id number] Date of Birth/Sex: 10-21-1980 (40 y.o. 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/09/2021 No Yes exposed T79.8XXA Other early complications of trauma, initial encounter 02/09/2021 No Yes I87.312 Chronic venous hypertension (idiopathic) with ulcer of left lower 02/23/2021 No Yes extremity Inactive Problems Resolved Problems Electronic Signature(s) Signed: 03/09/2021 9:19:47 AM By: Geralyn Corwin DO Entered By: Geralyn Corwin on 03/09/2021 09:16:06 Paul Hanna ([id number]) -------------------------------------------------------------------------------- Progress Note Details Patient Name: Paul Hanna Date of Service: 03/09/2021 8:30 AM Medical Record Number:  [id number] Patient Account Number: [id number] Date of Birth/Sex: 05-30-1980 (40 y.o. M) Treating RN: Hansel Feinstein Primary Care Provider: Vincent Gros Other Clinician: Referring Provider: Vincent Gros Treating Provider/Extender: Tilda Franco in Treatment: 4 Subjective Chief Complaint Information obtained from Patient Left lower extremity wound due to trauma History of Present Illness (HPI) Admission 02/09/2021 Mr. Sampson Self is a 41 year old male otherwise healthy that presents to the clinic for an abrasion to the left leg caused by a cement wall on Halloween night. He originally took care of the wound and use Neosporin and kept the area clean however it has not healed. He has chronic pain to the area. He visited the ED on 01/18/2021 for this issue and they treated him with doxycycline and mupirocin ointment. He completed treatment and followed up with his family medicine office Who started him on ciprofloxacin. He also completed this course. He reports no improvement to the wound site after these courses of antibiotics. He denies ever having an x-ray of the area. 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 2/15; Patient present for Follow-up. He has been using gentamicin ointment with Hydrofera Blue daily. He has no issues or complaints today. He denies signs of infection. 2/22; patient presents for follow-up. He tolerated the compression wrap well. He has no issues or complaints today. He denies signs of infection. He reports improvement in chronic pain to the wound site. 3/1; patient presents for follow-up. He has no issues or complaints today. He denies signs of infection. He continues to report improvement in chronic pain to the wound site. 3/8; patient presents for follow-up. He has tolerated the compression wrap well. He reports minimal pain to the wound site. He denies signs of infection. Objective Constitutional Vitals Time Taken: 8:40 AM,  Height: 71 in, Weight: 300 lbs, BMI: 41.8, Temperature: 98.3 F, Pulse: 83 bpm, Respiratory Rate: 16 breaths/min, Blood Pressure: 149/91 mmHg. General Notes: Left lower extremity: To the anterior aspect there is an open wound with granulation tissue, Hyper granulated tissue and nonviable tissue present. No surrounding signs of infection. Varicose veins noted Integumentary (Hair, Skin) Wound #1 status is Open. Original cause of wound was Skin Tear/Laceration. The date acquired was: 11/01/2020. The wound has been in treatment 4 weeks. The wound is located on the Left Lower Leg. The wound measures 1.7cm length x 1cm width x 0.1cm depth; 1.335cm^2 area and 0.134cm^3 volume. There is Fat Layer (Subcutaneous Tissue) exposed. There is no tunneling or undermining noted. There is a medium amount of serosanguineous drainage noted. There is large (67-100%) red, hyper - granulation within the wound bed.  There is a small (1-33%) amount of necrotic tissue within the wound bed including Adherent Slough. Assessment Active Problems ICD-10 Non-pressure chronic ulcer of other part of left lower leg with fat layer exposed Other early complications of trauma, initial encounter Chronic venous hypertension (idiopathic) with ulcer of left lower extremity Sefcik, Jerrick ([id number]) Patient's wound has shown improvement in size and appearance since last clinic visit. I debrided nonviable tissue. Postdebridement measurements are smaller. No signs of surrounding infection. I used silver nitrate to the hyper granulated areas. I recommended continuing with Hydrofera Blue under 3 layer compression. Follow-up in 1 week Procedures Wound #1 Pre-procedure diagnosis of Wound #1 is a Trauma, Other located on the Left Lower Leg . There was a Excisional Skin/Subcutaneous Tissue Debridement with a total area of 1.7 sq cm performed by Geralyn CorwinHoffman, Dayani Winbush, MD. With the following instrument(s): Curette to remove Viable and Non-Viable  tissue/material. Material removed includes Subcutaneous Tissue and Slough and after achieving pain control using Lidocaine. A time out was conducted at 09:06, prior to the start of the procedure. A Moderate amount of bleeding was controlled with Silver Nitrate. The procedure was tolerated well. Post Debridement Measurements: 1.7cm length x 1cm width x 0.1cm depth; 0.134cm^3 volume. Character of Wound/Ulcer Post Debridement is improved. Post procedure Diagnosis Wound #1: Same as Pre-Procedure General Notes: 2 silver nitrate sticks used for hypergranulation tissue. Pre-procedure diagnosis of Wound #1 is a Trauma, Other located on the Left Lower Leg . There was a Three Layer Compression Therapy Procedure by Hansel FeinsteinBishop, Joy, RN. Post procedure Diagnosis Wound #1: Same as Pre-Procedure Plan Follow-up Appointments: Return Appointment in 1 week. Nurse Visit as needed Bathing/ Shower/ Hygiene: May shower with wound dressing protected with water repellent cover or cast protector. No tub bath. Anesthetic (Use 'Patient Medications' Section for Anesthetic Order Entry): Lidocaine applied to wound bed Edema Control - Lymphedema / Segmental Compressive Device / Other: Optional: One layer of unna paste to top of compression wrap (to act as an anchor). Patient to wear own compression stockings. Remove compression stockings every night before going to bed and put on every morning when getting up. Elevate leg(s) parallel to the floor when sitting. Additional Orders / Instructions: Follow Nutritious Diet and Increase Protein Intake Medications-Please add to medication list.: Take one 500mg  Tylenol (Acetaminophen) and one 200mg  Motrin (Ibuprofen) every 6 hours for pain. Do not take ibuprofen if you are on blood thinners or have stomach ulcers. WOUND #1: - Lower Leg Wound Laterality: Left Cleanser: Normal Saline 1 x Per Week/15 Days Discharge Instructions: Wash your hands with soap and water. Remove old  dressing, discard into plastic bag and place into trash. Cleanse the wound with Normal Saline prior to applying a clean dressing using gauze sponges, not tissues or cotton balls. Do not scrub or use excessive force. Pat dry using gauze sponges, not tissue or cotton balls. Cleanser: Soap and Water 1 x Per Week/15 Days Discharge Instructions: Gently cleanse wound with antibacterial soap, rinse and pat dry prior to dressing wounds Cleanser: Wound Cleanser 1 x Per Week/15 Days Discharge Instructions: Wash your hands with soap and water. Remove old dressing, discard into plastic bag and place into trash. Cleanse the wound with Wound Cleanser prior to applying a clean dressing using gauze sponges, not tissues or cotton balls. Do not scrub or use excessive force. Pat dry using gauze sponges, not tissue or cotton balls. Primary Dressing: Hydrofera Blue Ready Transfer Foam, 4x5 (in/in) 1 x Per Week/15 Days Discharge Instructions: Apply Hydrofera  Blue Ready to wound bed as directed Secondary Dressing: Gauze 1 x Per Week/15 Days Compression Wrap: 3-LAYER WRAP - Profore Lite LF 3 Multilayer Compression Bandaging System 1 x Per Week/15 Days Discharge Instructions: Apply 3 multi-layer wrap as prescribed. 1. In office sharp debridement 2. Hydrofera Blue under 3 layer compression 3. Follow-up in 1 week JOHNLUKE, HAUGEN ([id number]) Electronic Signature(s) Signed: 03/09/2021 9:19:47 AM By: Geralyn Corwin DO Entered By: Geralyn Corwin on 03/09/2021 09:18:37 Paul Hanna ([id number]) -------------------------------------------------------------------------------- SuperBill Details Patient Name: Paul Hanna Date of Service: 03/09/2021 Medical Record Number: [id number] Patient Account Number: [id number] Date of Birth/Sex: [birth year] (40 y.o. M) Treating RN: Hansel Feinstein Primary Care Provider: Vincent Gros Other Clinician: Referring Provider: Vincent Gros Treating Provider/Extender: Tilda Franco  in Treatment: 4 Diagnosis Coding ICD-10 Codes Code Description [phone] Non-pressure chronic ulcer of other part of left lower leg with fat layer exposed T79.8XXA Other early complications of trauma, initial encounter I87.312 Chronic venous hypertension (idiopathic) with ulcer of left lower extremity Facility Procedures CPT4 Code: [id number] Description: 11042 - DEB SUBQ TISSUE 20 SQ CM/< Modifier: Quantity: 1 CPT4 Code: Description: ICD-10 Diagnosis Description L97.822 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

## 2021-03-09 NOTE — Progress Notes (Signed)
Paul, Hanna ([id number]) Visit Report for 03/09/2021 Arrival Information Details Patient Name: Paul, Hanna Date of Service: 03/09/2021 8:30 AM Medical Record Number: [id number] Patient Account Number: [id number] Date of Birth/Sex: 1980-10-08 (41 y.o. M) Treating RN: Donnamarie Poag Primary Care Abe Schools: Leretha Pol Other Clinician: Referring Jeromey Kruer: Leretha Pol Treating Deniro Laymon/Extender: Yaakov Guthrie in Treatment: 4 Visit Information History Since Last Visit Added or deleted any medications: No Patient Arrived: Ambulatory Had a fall or experienced change in No Arrival Time: 08:38 activities of daily living that may affect Accompanied By: self risk of falls: Transfer Assistance: None Hospitalized since last visit: No Patient Requires Transmission-Based Precautions: No Has Dressing in Place as Prescribed: Yes Patient Has Alerts: Yes Has Compression in Place as Prescribed: Yes Patient Alerts: NOT Diabetic Pain Present Now: Yes Electronic Signature(s) Signed: 03/09/2021 12:55:43 PM By: Donnamarie Poag Entered By: Donnamarie Poag on 03/09/2021 08:40:51 Paul Hanna ([id number]) -------------------------------------------------------------------------------- Compression Therapy Details Patient Name: Paul Hanna Date of Service: 03/09/2021 8:30 AM Medical Record Number: [id number] Patient Account Number: [id number] Date of Birth/Sex: [birth year] (41 y.o. M) Treating RN: Donnamarie Poag Primary Care Makaria Poarch: Leretha Pol Other Clinician: Referring Loyce Flaming: Leretha Pol Treating Mauro Arps/Extender: Yaakov Guthrie in Treatment: 4 Compression Therapy Performed for Wound Assessment: Wound #1 Left Lower Leg Performed By: Clinician Donnamarie Poag, RN Compression Type: Three Layer Post Procedure Diagnosis Same as Pre-procedure Electronic Signature(s) Signed: 03/09/2021 12:55:43 PM By: Donnamarie Poag Entered By: Donnamarie Poag on 03/09/2021 08:51:51 Paul Hanna  ([id number]) -------------------------------------------------------------------------------- Encounter Discharge Information Details Patient Name: Paul Hanna Date of Service: 03/09/2021 8:30 AM Medical Record Number: [id number] Patient Account Number: [id number] Date of Birth/Sex: 01-15-1980 (41 y.o. M) Treating RN: Donnamarie Poag Primary Care Laurielle Selmon: Leretha Pol Other Clinician: Referring Jatavion Peaster: Leretha Pol Treating Terri Rorrer/Extender: Yaakov Guthrie in Treatment: 4 Encounter Discharge Information Items Post Procedure Vitals Discharge Condition: Stable Temperature (F): 98.3 Ambulatory Status: Ambulatory Pulse (bpm): 83 Discharge Destination: Home Respiratory Rate (breaths/min): 16 Transportation: Private Auto Blood Pressure (mmHg): 149/91 Accompanied By: self Schedule Follow-up Appointment: Yes Clinical Summary of Care: Electronic Signature(s) Signed: 03/09/2021 12:55:43 PM By: Donnamarie Poag Entered By: Donnamarie Poag on 03/09/2021 09:25:07 Paul Hanna ([id number]) -------------------------------------------------------------------------------- Lower Extremity Assessment Details Patient Name: Paul Hanna Date of Service: 03/09/2021 8:30 AM Medical Record Number: [id number] Patient Account Number: [id number] Date of Birth/Sex: February 19, 1980 (41 y.o. M) Treating RN: Donnamarie Poag Primary Care Saranne Crislip: Leretha Pol Other Clinician: Referring Ariana Cavenaugh: Leretha Pol Treating Dequante Tremaine/Extender: Yaakov Guthrie in Treatment: 4 Edema Assessment Assessed: [Left: Yes] [Right: No] [Left: Edema] [Right: :] Calf Left: Right: Point of Measurement: 31 cm From Medial Instep 46 cm Ankle Left: Right: Point of Measurement: 10 cm From Medial Instep 26 cm Vascular Assessment Pulses: Dorsalis Pedis Palpable: [Left:Yes] Electronic Signature(s) Signed: 03/09/2021 12:55:43 PM By: Donnamarie Poag Entered ByDonnamarie Poag on 03/09/2021 08:48:19 Paul Hanna  ([id number]) -------------------------------------------------------------------------------- Multi Wound Chart Details Patient Name: Paul Hanna Date of Service: 03/09/2021 8:30 AM Medical Record Number: [id number] Patient Account Number: [id number] Date of Birth/Sex: 12/10/1980 (41 y.o. M) Treating RN: Donnamarie Poag Primary Care Lennin Osmond: Leretha Pol Other Clinician: Referring Latroya Ng: Leretha Pol Treating Paelyn Smick/Extender: Yaakov Guthrie in Treatment: 4 Vital Signs Height(in): 71 Pulse(bpm): 80 Weight(lbs): 300 Blood Pressure(mmHg): 149/91 Body Mass Index(BMI): 41.8 Temperature(F): 98.3 Respiratory Rate(breaths/min): 16 Photos: [N/A:N/A] Wound Location: Left Lower Leg N/A N/A Wounding Event: Skin Tear/Laceration N/A N/A Primary Etiology: Trauma, Other N/A N/A Date Acquired: 11/01/2020 N/A N/A Weeks of Treatment: 4 N/A N/A Wound Status: Open N/A N/A Wound Recurrence: No  N/A N/A Measurements L x W x D (cm) 1.7x1x0.1 N/A N/A Area (cm) : 1.335 N/A N/A Volume (cm) : 0.134 N/A N/A % Reduction in Area: 46.00% N/A N/A % Reduction in Volume: 45.70% N/A N/A Classification: Full Thickness Without Exposed N/A N/A Support Structures Exudate Amount: Medium N/A N/A Exudate Type: Serosanguineous N/A N/A Exudate Color: red, brown N/A N/A Granulation Amount: Large (67-100%) N/A N/A Granulation Quality: Red, Hyper-granulation N/A N/A Necrotic Amount: Small (1-33%) N/A N/A Exposed Structures: Fat Layer (Subcutaneous Tissue): N/A N/A Yes Fascia: No Tendon: No Muscle: No Joint: No Bone: No Epithelialization: None N/A N/A Treatment Notes Electronic Signature(s) Signed: 03/09/2021 12:55:43 PM By: Donnamarie Poag Entered By: Donnamarie Poag on 03/09/2021 08:49:52 Paul Hanna ([id number]) -------------------------------------------------------------------------------- Java Details Patient Name: Paul Hanna Date of Service: 03/09/2021 8:30 AM Medical  Record Number: [id number] Patient Account Number: [id number] Date of Birth/Sex: November 06, 1980 (41 y.o. M) Treating RN: Donnamarie Poag Primary Care Angy Swearengin: Leretha Pol Other Clinician: Referring Kamryn Messineo: Leretha Pol Treating Dai Apel/Extender: Yaakov Guthrie in Treatment: 4 Active Inactive Wound/Skin Impairment Nursing Diagnoses: Impaired tissue integrity Knowledge deficit related to smoking impact on wound healing Knowledge deficit related to ulceration/compromised skin integrity Goals: Patient/caregiver will verbalize understanding of skin care regimen Date Initiated: 02/09/2021 Date Inactivated: 02/23/2021 Target Resolution Date: 02/25/2021 Goal Status: Met Ulcer/skin breakdown will have a volume reduction of 30% by week 4 Date Initiated: 02/09/2021 Target Resolution Date: 03/09/2021 Goal Status: Active Ulcer/skin breakdown will have a volume reduction of 50% by week 8 Date Initiated: 02/09/2021 Target Resolution Date: 04/06/2021 Goal Status: Active Ulcer/skin breakdown will have a volume reduction of 80% by week 12 Date Initiated: 02/09/2021 Target Resolution Date: 05/04/2021 Goal Status: Active Ulcer/skin breakdown will heal within 14 weeks Date Initiated: 02/09/2021 Target Resolution Date: 05/18/2021 Goal Status: Active Interventions: Assess patient/caregiver ability to obtain necessary supplies Assess patient/caregiver ability to perform ulcer/skin care regimen upon admission and as needed Assess ulceration(s) every visit Notes: Electronic Signature(s) Signed: 03/09/2021 12:55:43 PM By: Donnamarie Poag Entered By: Donnamarie Poag on 03/09/2021 08:49:41 Paul Hanna ([id number]) -------------------------------------------------------------------------------- Pain Assessment Details Patient Name: Paul Hanna Date of Service: 03/09/2021 8:30 AM Medical Record Number: [id number] Patient Account Number: [id number] Date of Birth/Sex: [birth year] (41 y.o. M) Treating RN: Donnamarie Poag Primary Care Natacia Chaisson: Leretha Pol Other Clinician: Referring Shaquaya Wuellner: Leretha Pol Treating Tayja Manzer/Extender: Yaakov Guthrie in Treatment: 4 Active Problems Location of Pain Severity and Description of Pain Patient Has Paino Yes Site Locations Pain Location: Pain in Ulcers Rate the pain. Current Pain Level: 2 Pain Management and Medication Current Pain Management: Electronic Signature(s) Signed: 03/09/2021 12:55:43 PM By: Donnamarie Poag Entered By: Donnamarie Poag on 03/09/2021 08:41:35 Paul Hanna ([id number]) -------------------------------------------------------------------------------- Patient/Caregiver Education Details Patient Name: Paul Hanna Date of Service: 03/09/2021 8:30 AM Medical Record Number: [id number] Patient Account Number: [id number] Date of Birth/Gender: 1980/09/17 (41 y.o. M) Treating RN: Donnamarie Poag Primary Care Physician: Leretha Pol Other Clinician: Referring Physician: Leretha Pol Treating Physician/Extender: Yaakov Guthrie in Treatment: 4 Education Assessment Education Provided To: Patient Education Topics Provided Wound/Skin Impairment: Electronic Signature(s) Signed: 03/09/2021 12:55:43 PM By: Donnamarie Poag Entered By: Donnamarie Poag on 03/09/2021 09:21:51 Paul Hanna ([id number]) -------------------------------------------------------------------------------- Wound Assessment Details Patient Name: Paul Hanna Date of Service: 03/09/2021 8:30 AM Medical Record Number: [id number] Patient Account Number: [id number] Date of Birth/Sex: December 05, 1980 (41 y.o. M) Treating RN: Donnamarie Poag Primary Care Charleston Vierling: Leretha Pol Other Clinician: Referring Yesmin Mutch: Leretha Pol Treating Macaiah Mangal/Extender: Yaakov Guthrie in Treatment: 4 Wound Status Wound Number: 1  Primary Etiology: Trauma, Other Wound Location: Left Lower Leg Wound Status: Open Wounding Event: Skin Tear/Laceration Date Acquired: 11/01/2020 Weeks  Of Treatment: 4 Clustered Wound: No Photos Wound Measurements Length: (cm) 1.7 Width: (cm) 1 Depth: (cm) 0.1 Area: (cm) 1.335 Volume: (cm) 0.134 % Reduction in Area: 46% % Reduction in Volume: 45.7% Epithelialization: None Tunneling: No Undermining: No Wound Description Classification: Full Thickness Without Exposed Support Structures Exudate Amount: Medium Exudate Type: Serosanguineous Exudate Color: red, brown Foul Odor After Cleansing: No Slough/Fibrino Yes Wound Bed Granulation Amount: Large (67-100%) Exposed Structure Granulation Quality: Red, Hyper-granulation Fascia Exposed: No Necrotic Amount: Small (1-33%) Fat Layer (Subcutaneous Tissue) Exposed: Yes Necrotic Quality: Adherent Slough Tendon Exposed: No Muscle Exposed: No Joint Exposed: No Bone Exposed: No Treatment Notes Wound #1 (Lower Leg) Wound Laterality: Left Cleanser Normal Saline Discharge Instruction: Wash your hands with soap and water. Remove old dressing, discard into plastic bag and place into trash. Cleanse the wound with Normal Saline prior to applying a clean dressing using gauze sponges, not tissues or cotton balls. Do not scrub or use excessive force. Pat dry using gauze sponges, not tissue or cotton balls. Soap and Water Fowler, OCEAN ([id number]) Discharge Instruction: Gently cleanse wound with antibacterial soap, rinse and pat dry prior to dressing wounds Wound Cleanser Discharge Instruction: Wash your hands with soap and water. Remove old dressing, discard into plastic bag and place into trash. Cleanse the wound with Wound Cleanser prior to applying a clean dressing using gauze sponges, not tissues or cotton balls. Do not scrub or use excessive force. Pat dry using gauze sponges, not tissue or cotton balls. Peri-Wound Care Topical Primary Dressing Hydrofera Blue Ready Transfer Foam, 4x5 (in/in) Discharge Instruction: Apply Hydrofera Blue Ready to wound bed as directed Secondary  Dressing Gauze Secured With Compression Wrap 3-LAYER WRAP - Profore Lite LF 3 Multilayer Compression Bandaging System Discharge Instruction: Apply 3 multi-layer wrap as prescribed. Compression Stockings Add-Ons Electronic Signature(s) Signed: 03/09/2021 12:55:43 PM By: Donnamarie Poag Entered By: Donnamarie Poag on 03/09/2021 08:47:19 Paul Hanna ([id number]) -------------------------------------------------------------------------------- Bertha Details Patient Name: Paul Hanna Date of Service: 03/09/2021 8:30 AM Medical Record Number: [id number] Patient Account Number: [id number] Date of Birth/Sex: 1980-01-17 (41 y.o. M) Treating RN: Donnamarie Poag Primary Care Nargis Abrams: Leretha Pol Other Clinician: Referring Yandriel Boening: Leretha Pol Treating Zaine Elsass/Extender: Yaakov Guthrie in Treatment: 4 Vital Signs Time Taken: 08:40 Temperature (F): 98.3 Height (in): 71 Pulse (bpm): 83 Weight (lbs): 300 Respiratory Rate (breaths/min): 16 Body Mass Index (BMI): 41.8 Blood Pressure (mmHg): 149/91 Reference Range: 80 - 120 mg / dl Electronic Signature(s) Signed: 03/09/2021 12:55:43 PM By: Donnamarie Poag Entered ByDonnamarie Poag on 03/09/2021 08:41:19

## 2021-03-16 ENCOUNTER — Encounter (HOSPITAL_BASED_OUTPATIENT_CLINIC_OR_DEPARTMENT_OTHER): Payer: BC Managed Care – PPO | Admitting: Internal Medicine

## 2021-03-16 ENCOUNTER — Other Ambulatory Visit: Payer: Self-pay

## 2021-03-16 DIAGNOSIS — L97822 Non-pressure chronic ulcer of other part of left lower leg with fat layer exposed: Secondary | ICD-10-CM | POA: Diagnosis not present

## 2021-03-16 NOTE — Progress Notes (Signed)
Paul Hanna, Paul Hanna ([id number]) ?Visit Report for 03/16/2021 ?Chief Complaint Document Details ?Patient Name: Paul Hanna, Paul Hanna ?Date of Service: 03/16/2021 8:30 AM ?Medical Record Number: [id number] ?Patient Account Number: [id number] ?Date of Birth/Sex: [birth year] (41 y.o. M) ?Treating RN: Donnamarie Poag ?Primary Care Provider: Leretha Pol Other Clinician: ?Referring Provider: Leretha Pol ?Treating Provider/Extender: Kalman Shan ?Weeks in Treatment: 5 ?Information Obtained from: Patient ?Chief Complaint ?Left lower extremity wound due to trauma ?Electronic Signature(s) ?Signed: 03/16/2021 9:32:24 AM By: Kalman Shan DO ?Entered By: Kalman Shan on 03/16/2021 09:24:32 ?Paul Hanna, Paul Hanna ([id number]) ?-------------------------------------------------------------------------------- ?Debridement Details ?Patient Name: Paul Hanna ?Date of Service: 03/16/2021 8:30 AM ?Medical Record Number: [id number] ?Patient Account Number: [id number] ?Date of Birth/Sex: [birth year] (41 y.o. M) ?Treating RN: Donnamarie Poag ?Primary Care Provider: Leretha Pol Other Clinician: ?Referring Provider: Leretha Pol ?Treating Provider/Extender: Kalman Shan ?Weeks in Treatment: 5 ?Debridement Performed for ?Wound #1 Left Lower Leg ?Assessment: ?Performed By: Physician Kalman Shan, MD ?Debridement Type: Debridement ?Level of Consciousness (Pre- ?Awake and Alert ?procedure): ?Pre-procedure Verification/Time Out ?Yes - 08:57 ?Taken: ?Start Time: 08:58 ?Pain Control: Lidocaine ?Total Area Debrided (L x W): 1.6 (cm) x 0.8 (cm) = 1.28 (cm?) ?Tissue and other material ?Viable, Non-Viable, Slough, Subcutaneous, [place] ?debrided: ?Level: Skin/Subcutaneous Tissue ?Debridement Description: Excisional ?Instrument: Curette ?Bleeding: Minimum ?Hemostasis Achieved: Pressure ?Response to Treatment: Procedure was tolerated well ?Level of Consciousness (Post- ?Awake and Alert ?procedure): ?Post Debridement Measurements of Total Wound ?Length: (cm)  1.6 ?Width: (cm) 0.8 ?Depth: (cm) 0.1 ?Volume: (cm?) 0.101 ?Character of Wound/Ulcer Post Debridement: Improved ?Post Procedure Diagnosis ?Same as Pre-procedure ?Notes ?1 silver nitrate stick used for hypergranulation tissue and tolerated well ?Electronic Signature(s) ?Signed: 03/16/2021 9:32:24 AM By: Kalman Shan DO ?Signed: 03/16/2021 11:56:30 AM By: Donnamarie Poag ?Entered ByDonnamarie Poag on 03/16/2021 09:03:55 ?Paul Hanna, Paul Hanna ([id number]) ?-------------------------------------------------------------------------------- ?HPI Details ?Patient Name: Paul Hanna, Paul Hanna ?Date of Service: 03/16/2021 8:30 AM ?Medical Record Number: [id number] ?Patient Account Number: [id number] ?Date of Birth/Sex: 08-Jun-1980 (41 y.o. M) ?Treating RN: Donnamarie Poag ?Primary Care Provider: Leretha Pol Other Clinician: ?Referring Provider: Leretha Pol ?Treating Provider/Extender: Kalman Shan ?Weeks in Treatment: 5 ?History of Present Illness ?HPI Description: Admission 02/09/2021 ?Mr. Yaw Klick is a 42 year old male otherwise healthy that presents to the clinic for an abrasion to the left leg caused by a cement wall on ?Halloween night. He originally took care of the wound and use Neosporin and kept the area clean however it has not healed. He has chronic pain ?to the area. He visited the ED on 01/18/2021 for this issue and they treated him with doxycycline and mupirocin ointment. He completed treatment ?and followed up with his family medicine office Who started him on ciprofloxacin. He also completed this course. He reports no improvement to ?the wound site after these courses of antibiotics. He denies ever having an x-ray of the area. 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 He is on his ?feet most of the day. ?2/15; Patient present for Follow-up. He has been using gentamicin ointment with Hydrofera Blue daily. He has no issues or complaints today. He ?denies signs of infection. ?2/22; patient presents for follow-up. He tolerated the  compression wrap well. He has no issues or complaints today. He denies signs of infection. ?He reports improvement in chronic pain to the wound site. ?3/1; patient presents for follow-up. He has no issues or complaints today. He denies signs of infection. He continues to report improvement in ?chronic pain to the wound site. ?3/8; patient presents for follow-up. He has tolerated the compression wrap well. He  reports minimal pain to the wound site. He denies signs of ?infection. ?3/15; patient presents for follow-up. He has no issues or complaints today. ?Electronic Signature(s) ?Signed: 03/16/2021 9:32:24 AM By: Kalman Shan DO ?Entered By: Kalman Shan on 03/16/2021 09:24:49 ?Paul Hanna, Paul Hanna ([id number]) ?-------------------------------------------------------------------------------- ?Physical Exam Details ?Patient Name: Paul Hanna, Paul Hanna ?Date of Service: 03/16/2021 8:30 AM ?Medical Record Number: [id number] ?Patient Account Number: [id number] ?Date of Birth/Sex: Jun 06, 1980 (41 y.o. M) ?Treating RN: Donnamarie Poag ?Primary Care Provider: Leretha Pol Other Clinician: ?Referring Provider: Leretha Pol ?Treating Provider/Extender: Kalman Shan ?Weeks in Treatment: 5 ?Constitutional ?. ?Cardiovascular ?Marland Kitchen ?Psychiatric ?Marland Kitchen ?Notes ?Left lower extremity: To the anterior aspect there is an open wound with granulation tissue, Hyper granulated tissue and nonviable tissue present. ?No surrounding signs of infection. Varicose veins noted ?Electronic Signature(s) ?Signed: 03/16/2021 9:32:24 AM By: Kalman Shan DO ?Entered By: Kalman Shan on 03/16/2021 09:25:08 ?Paul Hanna, Paul Hanna ([id number]) ?-------------------------------------------------------------------------------- ?Physician Orders Details ?Patient Name: Paul Hanna, Paul Hanna ?Date of Service: 03/16/2021 8:30 AM ?Medical Record Number: [id number] ?Patient Account Number: [id number] ?Date of Birth/Sex: [birth year] (41 y.o. M) ?Treating RN: Donnamarie Poag ?Primary Care Provider:  Leretha Pol Other Clinician: ?Referring Provider: Leretha Pol ?Treating Provider/Extender: Kalman Shan ?Weeks in Treatment: 5 ?Verbal / Phone Orders: No ?Diagnosis Coding ?Follow-up Appointments ?o Return Appointment in 1 week. ?o Nurse Visit as needed ?Bathing/ Shower/ Hygiene ?o May shower with wound dressing protected with water repellent cover or cast protector. ?o No tub bath. ?Anesthetic (Use 'Patient Medications' Section for Anesthetic Order Entry) ?o Lidocaine applied to wound bed ?Edema Control - Lymphedema / Segmental Compressive Device / Other ?o Optional: One layer of unna paste to top of compression wrap (to act as an anchor). ?o Patient to wear own compression stockings. Remove compression stockings every night before going to bed and put on ?every morning when getting up. - right leg ?o Elevate leg(s) parallel to the floor when sitting. ?o DO YOUR BEST to sleep in the bed at night. DO NOT sleep in your recliner. Long hours of sitting in a recliner leads to ?swelling of the legs and/or potential wounds on your backside. ?Additional Orders / Instructions ?o Follow Nutritious Diet and Increase Protein Intake ?Medications-Please add to medication list. ?o Take one 500mg  Tylenol (Acetaminophen) and one 200mg  Motrin (Ibuprofen) every 6 hours for pain. Do not take ibuprofen ?if you are on blood thinners or have stomach ulcers. ?Wound Treatment ?Wound #1 - Lower Leg Wound Laterality: Left ?Cleanser: Normal Saline 1 x Per Week/15 Days ?Discharge Instructions: Wash your hands with soap and water. Remove old dressing, discard into plastic bag and place into trash. ?Cleanse the wound with Normal Saline prior to applying a clean dressing using gauze sponges, not tissues or cotton balls. Do not scrub ?or use excessive force. Pat dry using gauze sponges, not tissue or cotton balls. ?Cleanser: Soap and Water 1 x Per Week/15 Days ?Discharge Instructions: Gently cleanse wound with  antibacterial soap, rinse and pat dry prior to dressing wounds ?Cleanser: Wound Cleanser 1 x Per Week/15 Days ?Discharge Instructions: Wash your hands with soap and water. Remove old dressing, discard into plastic bag

## 2021-03-16 NOTE — Progress Notes (Signed)
Paul Hanna, Paul Hanna ([id number]) ?Visit Report for 03/16/2021 ?Arrival Information Details ?Patient Name: Paul Hanna, Paul Hanna ?Date of Service: 03/16/2021 8:30 AM ?Medical Record Number: [id number] ?Patient Account Number: [id number] ?Date of Birth/Sex: [birth year] (41 y.o. M) ?Treating RN: Donnamarie Poag ?Primary Care Nicola Quesnell: Leretha Pol Other Clinician: ?Referring Emilianna Barlowe: Leretha Pol ?Treating Sharna Gabrys/Extender: Kalman Shan ?Weeks in Treatment: 5 ?Visit Information History Since Last Visit ?Added or deleted any medications: No ?Patient Arrived: Ambulatory ?Had a fall or experienced change in No ?Arrival Time: 08:40 ?activities of daily living that may affect ?Accompanied By: self ?risk of falls: ?Transfer Assistance: None ?Hospitalized since last visit: No ?Patient Identification Verified: Yes ?Has Dressing in Place as Prescribed: Yes ?Secondary Verification Process Completed: Yes ?Has Compression in Place as Prescribed: Yes ?Patient Requires Transmission-Based Precautions: No ?Pain Present Now: Yes ?Patient Has Alerts: Yes ?Patient Alerts: NOT Diabetic ?Electronic Signature(s) ?Signed: 03/16/2021 11:56:30 AM By: Donnamarie Poag ?Entered ByDonnamarie Poag on 03/16/2021 08:42:39 ?Paul Hanna, Paul Hanna ([id number]) ?-------------------------------------------------------------------------------- ?Compression Therapy Details ?Patient Name: Paul Hanna, Paul Hanna ?Date of Service: 03/16/2021 8:30 AM ?Medical Record Number: [id number] ?Patient Account Number: [id number] ?Date of Birth/Sex: May 04, 1980 (41 y.o. M) ?Treating RN: Donnamarie Poag ?Primary Care Staysha Truby: Leretha Pol Other Clinician: ?Referring Shatavia Santor: Leretha Pol ?Treating Melquisedec Journey/Extender: Kalman Shan ?Weeks in Treatment: 5 ?Compression Therapy Performed for Wound Assessment: Wound #1 Left Lower Leg ?Performed By: Clinician Donnamarie Poag, RN ?Compression Type: Three Layer ?Post Procedure Diagnosis ?Same as Pre-procedure ?Electronic Signature(s) ?Signed: 03/16/2021 11:56:30 AM By:  Donnamarie Poag ?Entered ByDonnamarie Poag on 03/16/2021 08:51:01 ?Paul Hanna, Paul Hanna ([id number]) ?-------------------------------------------------------------------------------- ?Encounter Discharge Information Details ?Patient Name: Paul Hanna, Paul Hanna ?Date of Service: 03/16/2021 8:30 AM ?Medical Record Number: [id number] ?Patient Account Number: [id number] ?Date of Birth/Sex: August 11, 1980 (41 y.o. M) ?Treating RN: Donnamarie Poag ?Primary Care Akiel Fennell: Leretha Pol Other Clinician: ?Referring Varnell Donate: Leretha Pol ?Treating Aubrielle Stroud/Extender: Kalman Shan ?Weeks in Treatment: 5 ?Encounter Discharge Information Items Post Procedure Vitals ?Discharge Condition: Stable ?Temperature (?F): 98 ?Ambulatory Status: Ambulatory ?Pulse (bpm): 71 ?Discharge Destination: Home ?Respiratory Rate (breaths/min): 16 ?Transportation: Private Auto ?Blood Pressure (mmHg): 136/92 ?Accompanied By: self ?Schedule Follow-up Appointment: Yes ?Clinical Summary of Care: ?Electronic Signature(s) ?Signed: 03/16/2021 11:56:30 AM By: Donnamarie Poag ?Entered ByDonnamarie Poag on 03/16/2021 09:13:34 ?Paul Hanna, Paul Hanna ([id number]) ?-------------------------------------------------------------------------------- ?Lower Extremity Assessment Details ?Patient Name: Paul Hanna, Paul Hanna ?Date of Service: 03/16/2021 8:30 AM ?Medical Record Number: [id number] ?Patient Account Number: [id number] ?Date of Birth/Sex: 1980-09-18 (41 y.o. M) ?Treating RN: Donnamarie Poag ?Primary Care Torre Pikus: Leretha Pol Other Clinician: ?Referring Azura Tufaro: Leretha Pol ?Treating Zaiyah Sottile/Extender: Kalman Shan ?Weeks in Treatment: 5 ?Edema Assessment ?Assessed: [Left: Yes] [Right: No] ?Edema: [Left: N] [Right: o] ?Calf ?Left: Right: ?Point of Measurement: 31 cm From Medial Instep 44.5 cm ?Ankle ?Left: Right: ?Point of Measurement: 10 cm From Medial Instep 26 cm ?Vascular Assessment ?Pulses: ?Dorsalis Pedis ?Palpable: [Left:Yes] ?Electronic Signature(s) ?Signed: 03/16/2021 11:56:30 AM By: Donnamarie Poag ?Entered ByDonnamarie Poag on 03/16/2021 08:49:23 ?Paul Hanna, Paul Hanna ([id number]) ?-------------------------------------------------------------------------------- ?Multi Wound Chart Details ?Patient Name: Paul Hanna, Paul Hanna ?Date of Service: 03/16/2021 8:30 AM ?Medical Record Number: [id number] ?Patient Account Number: [id number] ?Date of Birth/Sex: [birth year] (41 y.o. M) ?Treating RN: Donnamarie Poag ?Primary Care Jenyfer Trawick: Leretha Pol Other Clinician: ?Referring Velma Hanna: Leretha Pol ?Treating Janaiya Beauchesne/Extender: Kalman Shan ?Weeks in Treatment: 5 ?Vital Signs ?Height(in): 71 ?Pulse(bpm): 71 ?Weight(lbs): 300 ?Blood Pressure(mmHg): 136/92 ?Body Mass Index(BMI): 41.8 ?Temperature(??F): 98 ?Respiratory Rate(breaths/min): 16 ?Photos: [N/A:N/A] ?Wound Location: Left Lower Leg N/A N/A ?Wounding Event: Skin Tear/Laceration N/A N/A ?Primary Etiology: Trauma, Other N/A N/A ?Date Acquired: 11/01/2020 N/A N/A ?Weeks of Treatment: 5  N/A N/A ?Wound Status: Open N/A N/A ?Wound Recurrence: No N/A N/A ?Measurements L x W x D (cm) 1.6x0.8x0.1 N/A N/A ?Area (cm?) : 1.005 N/A N/A ?Volume (cm?) : 0.101 N/A N/A ?% Reduction in Area: 59.40% N/A N/A ?% Reduction in Volume: 59.10% N/A N/A ?Classification: Full Thickness Without Exposed N/A N/A ?Support Structures ?Exudate Amount: Medium N/A N/A ?Exudate Type: Serosanguineous N/A N/A ?Exudate Color: red, brown N/A N/A ?Granulation Amount: Large (67-100%) N/A N/A ?Granulation Quality: Red, Hyper-granulation N/A N/A ?Necrotic Amount: Small (1-33%) N/A N/A ?Exposed Structures: ?Fat Layer (Subcutaneous Tissue): N/A N/A ?Yes ?Fascia: No ?Tendon: No ?Muscle: No ?Joint: No ?Bone: No ?Epithelialization: None N/A N/A ?Treatment Notes ?Electronic Signature(s) ?Signed: 03/16/2021 11:56:30 AM By: Donnamarie Poag ?Entered ByDonnamarie Poag on 03/16/2021 08:49:44 ?Paul Hanna, Paul Hanna ([id number]) ?-------------------------------------------------------------------------------- ?Multi-Disciplinary Care Plan  Details ?Patient Name: Paul Hanna, Paul Hanna ?Date of Service: 03/16/2021 8:30 AM ?Medical Record Number: [id number] ?Patient Account Number: [id number] ?Date of Birth/Sex: 24-Sep-1980 (41 y.o. M) ?Treating RN: Donnamarie Poag ?Primary Care Zaida Reiland: Leretha Pol Other Clinician: ?Referring Alim Cattell: Leretha Pol ?Treating Decarlos Empey/Extender: Kalman Shan ?Weeks in Treatment: 5 ?Active Inactive ?Wound/Skin Impairment ?Nursing Diagnoses: ?Impaired tissue integrity ?Knowledge deficit related to smoking impact on wound healing ?Knowledge deficit related to ulceration/compromised skin integrity ?Goals: ?Patient/caregiver will verbalize understanding of skin care regimen ?Date Initiated: 02/09/2021 ?Date Inactivated: 02/23/2021 ?Target Resolution Date: 02/25/2021 ?Goal Status: Met ?Ulcer/skin breakdown will have a volume reduction of 30% by week 4 ?Date Initiated: 02/09/2021 ?Target Resolution Date: 03/09/2021 ?Goal Status: Active ?Ulcer/skin breakdown will have a volume reduction of 50% by week 8 ?Date Initiated: 02/09/2021 ?Target Resolution Date: 04/06/2021 ?Goal Status: Active ?Ulcer/skin breakdown will have a volume reduction of 80% by week 12 ?Date Initiated: 02/09/2021 ?Target Resolution Date: 05/04/2021 ?Goal Status: Active ?Ulcer/skin breakdown will heal within 14 weeks ?Date Initiated: 02/09/2021 ?Target Resolution Date: 05/18/2021 ?Goal Status: Active ?Interventions: ?Assess patient/caregiver ability to obtain necessary supplies ?Assess patient/caregiver ability to perform ulcer/skin care regimen upon admission and as needed ?Assess ulceration(s) every visit ?Notes: ?Electronic Signature(s) ?Signed: 03/16/2021 11:56:30 AM By: Donnamarie Poag ?Entered ByDonnamarie Poag on 03/16/2021 08:49:32 ?Paul Hanna, KRESSE ([id number]) ?-------------------------------------------------------------------------------- ?Pain Assessment Details ?Patient Name: VAIBHAV, FOGLEMAN ?Date of Service: 03/16/2021 8:30 AM ?Medical Record Number: [id number] ?Patient Account Number:  [id number] ?Date of Birth/Sex: 1980-03-27 (41 y.o. M) ?Treating RN: Donnamarie Poag ?Primary Care Sullivan Jacuinde: Leretha Pol Other Clinician: ?Referring Aroura Vasudevan: Leretha Pol ?Treating Hazelle Woollard/Extender: Kalman Shan ?We

## 2021-03-23 ENCOUNTER — Other Ambulatory Visit: Payer: Self-pay

## 2021-03-23 ENCOUNTER — Encounter (HOSPITAL_BASED_OUTPATIENT_CLINIC_OR_DEPARTMENT_OTHER): Payer: BC Managed Care – PPO | Admitting: Internal Medicine

## 2021-03-23 DIAGNOSIS — L97822 Non-pressure chronic ulcer of other part of left lower leg with fat layer exposed: Secondary | ICD-10-CM | POA: Diagnosis not present

## 2021-03-23 NOTE — Progress Notes (Signed)
RAEFORD, BRANDENBURG ([id number]) ?Visit Report for 03/23/2021 ?Chief Complaint Document Details ?Patient Name: Paul Hanna, Paul Hanna ?Date of Service: 03/23/2021 8:30 AM ?Medical Record Number: [id number] ?Patient Account Number: [id number] ?Date of Birth/Sex: 07/13/1980 (41 y.o. M) ?Treating RN: Hansel Feinstein ?Primary Care Provider: Vincent Gros Other Clinician: ?Referring Provider: Vincent Gros ?Treating Provider/Extender: Geralyn Corwin ?Weeks in Treatment: 6 ?Information Obtained from: Patient ?Chief Complaint ?Left lower extremity wound due to trauma ?Electronic Signature(s) ?Signed: 03/23/2021 10:38:02 AM By: Geralyn Corwin DO ?Entered By: Geralyn Corwin on 03/23/2021 10:28:55 ?Paul Hanna, Paul Hanna ([id number]) ?-------------------------------------------------------------------------------- ?Debridement Details ?Patient Name: Paul Hanna ?Date of Service: 03/23/2021 8:30 AM ?Medical Record Number: [id number] ?Patient Account Number: [id number] ?Date of Birth/Sex: [birth year] (41 y.o. M) ?Treating RN: Hansel Feinstein ?Primary Care Provider: Vincent Gros Other Clinician: ?Referring Provider: Vincent Gros ?Treating Provider/Extender: Geralyn Corwin ?Weeks in Treatment: 6 ?Debridement Performed for ?Wound #1 Left Lower Leg ?Assessment: ?Performed By: Physician Geralyn Corwin, MD ?Debridement Type: Debridement ?Level of Consciousness (Pre- ?Awake and Alert ?procedure): ?Pre-procedure Verification/Time Out ?Yes - 09:23 ?Taken: ?Start Time: 09:24 ?Pain Control: Lidocaine ?Total Area Debrided (L x W): 1 (cm) x 0.9 (cm) = 0.9 (cm?) ?Tissue and other material ?Viable, Non-Viable, Slough, Subcutaneous, Slough ?debrided: ?Level: Skin/Subcutaneous Tissue ?Debridement Description: Excisional ?Instrument: Curette ?Bleeding: Minimum ?Hemostasis Achieved: Pressure ?Response to Treatment: Procedure was tolerated well ?Level of Consciousness (Post- ?Awake and Alert ?procedure): ?Post Debridement Measurements of Total Wound ?Length: (cm)  1 ?Width: (cm) 0.9 ?Depth: (cm) 0.1 ?Volume: (cm?) 0.071 ?Character of Wound/Ulcer Post Debridement: Improved ?Post Procedure Diagnosis ?Same as Pre-procedure ?Electronic Signature(s) ?Signed: 03/23/2021 10:38:02 AM By: Geralyn Corwin DO ?Signed: 03/23/2021 10:55:40 AM By: Hansel Feinstein ?Entered ByHansel Feinstein on 03/23/2021 09:26:29 ?Paul Hanna, Paul Hanna ([id number]) ?-------------------------------------------------------------------------------- ?HPI Details ?Patient Name: Paul Hanna, Paul Hanna ?Date of Service: 03/23/2021 8:30 AM ?Medical Record Number: [id number] ?Patient Account Number: [id number] ?Date of Birth/Sex: [birth year] (41 y.o. M) ?Treating RN: Hansel Feinstein ?Primary Care Provider: Vincent Gros Other Clinician: ?Referring Provider: Vincent Gros ?Treating Provider/Extender: Geralyn Corwin ?Weeks in Treatment: 6 ?History of Present Illness ?HPI Description: Admission 02/09/2021 ?Paul Hanna is a 41 year old male otherwise healthy that presents to the clinic for an abrasion to the left leg caused by a cement wall on ?Halloween night. He originally took care of the wound and use Neosporin and kept the area clean however it has not healed. He has chronic pain ?to the area. He visited the ED on 01/18/2021 for this issue and they treated him with doxycycline and mupirocin ointment. He completed treatment ?and followed up with his family medicine office Who started him on ciprofloxacin. He also completed this course. He reports no improvement to ?the wound site after these courses of antibiotics. He denies ever having an x-ray of the area. 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 He is on his ?feet most of the day. ?2/15; Patient present for Follow-up. He has been using gentamicin ointment with Hydrofera Blue daily. He has no issues or complaints today. He ?denies signs of infection. ?2/22; patient presents for follow-up. He tolerated the compression wrap well. He has no issues or complaints today. He denies signs of  infection. ?He reports improvement in chronic pain to the wound site. ?3/1; patient presents for follow-up. He has no issues or complaints today. He denies signs of infection. He continues to report improvement in ?chronic pain to the wound site. ?3/8; patient presents for follow-up. He has tolerated the compression wrap well. He reports minimal pain to the wound site. He denies signs of ?infection. ?  3/15; patient presents for follow-up. He has no issues or complaints today. ?3/22; patient presents for follow-up. He states that his wife has been diagnosed with metastatic cancer, unclear the primary source. He states he ?is unable to follow-up weekly in the clinic at least for the next month. We have been doing weekly compression wraps and he would like an ?alternative. He has no issues or complaints today regarding wound healing. ?Electronic Signature(s) ?Signed: 03/23/2021 10:38:02 AM By: Geralyn Corwin DO ?Entered By: Geralyn Corwin on 03/23/2021 10:31:34 ?Paul Hanna, Paul Hanna ([id number]) ?-------------------------------------------------------------------------------- ?Physical Exam Details ?Patient Name: Paul Hanna, Paul Hanna ?Date of Service: 03/23/2021 8:30 AM ?Medical Record Number: [id number] ?Patient Account Number: [id number] ?Date of Birth/Sex: May 01, 1980 (41 y.o. M) ?Treating RN: Hansel Feinstein ?Primary Care Provider: Vincent Gros Other Clinician: ?Referring Provider: Vincent Gros ?Treating Provider/Extender: Geralyn Corwin ?Weeks in Treatment: 6 ?Constitutional ?. ?Cardiovascular ?Marland Kitchen ?Psychiatric ?Marland Kitchen ?Notes ?Left lower extremity: To the anterior aspect there is an open wound with granulation tissue, Hyper granulated tissue and nonviable tissue present. ?No surrounding signs of infection. Varicose veins noted ?Electronic Signature(s) ?Signed: 03/23/2021 10:38:02 AM By: Geralyn Corwin DO ?Entered By: Geralyn Corwin on 03/23/2021 10:32:04 ?Paul Hanna, Paul Hanna  ([id number]) ?-------------------------------------------------------------------------------- ?Physician Orders Details ?Patient Name: Paul Hanna, Paul Hanna ?Date of Service: 03/23/2021 8:30 AM ?Medical Record Number: [id number] ?Patient Account Number: [id number] ?Date of Birth/Sex: [birth year] (41 y.o. M) ?Treating RN: Hansel Feinstein ?Primary Care Provider: Vincent Gros Other Clinician: ?Referring Provider: Vincent Gros ?Treating Provider/Extender: Geralyn Corwin ?Weeks in Treatment: 6 ?Verbal / Phone Orders: No ?Diagnosis Coding ?Follow-up Appointments ?o Return Appointment in 2 weeks. - prefers 2-3 weeks with family appointments/schedule unable weekly ?Bathing/ Shower/ Hygiene ?o May shower; gently cleanse wound with antibacterial soap, rinse and pat dry prior to dressing wounds ?o No tub bath. ?Anesthetic (Use 'Patient Medications' Section for Anesthetic Order Entry) ?o Lidocaine applied to wound bed ?Edema Control - Lymphedema / Segmental Compressive Device / Other ?o Tubigrip single layer applied. - Size E ?o Patient to wear own compression stockings. Remove compression stockings every night before going to bed and put on ?every morning when getting up. - see measurements ?o Elevate leg(s) parallel to the floor when sitting. ?o DO YOUR BEST to sleep in the bed at night. DO NOT sleep in your recliner. Long hours of sitting in a recliner leads to ?swelling of the legs and/or potential wounds on your backside. ?Additional Orders / Instructions ?o Follow Nutritious Diet and Increase Protein Intake ?Medications-Please add to medication list. ?o Take one 500mg  Tylenol (Acetaminophen) and one 200mg  Motrin (Ibuprofen) every 6 hours for pain. Do not take ibuprofen ?if you are on blood thinners or have stomach ulcers. ?Wound Treatment ?Wound #1 - Lower Leg Wound Laterality: Left ?Cleanser: Normal Saline 1 x Per Day/30 Days ?Discharge Instructions: Wash your hands with soap and water. Remove old dressing,  discard into plastic bag and place into trash. ?Cleanse the wound with Normal Saline prior to applying a clean dressing using gauze sponges, not tissues or cotton balls. Do not scrub ?or use excessive force. Pat dry using gauze sponges, not tissue or cotton balls. ?Cleanser: Soap and

## 2021-03-23 NOTE — Progress Notes (Addendum)
SANTIEL, TOPPER ([id number]) ?Visit Report for 03/23/2021 ?Arrival Information Details ?Patient Name: Paul Hanna, Paul Hanna ?Date of Service: 03/23/2021 8:30 AM ?Medical Record Number: [id number] ?Patient Account Number: [id number] ?Date of Birth/Sex: [birth year] (41 y.o. M) ?Treating RN: Hansel Feinstein ?Primary Care Izea Livolsi: Vincent Gros Other Clinician: ?Referring Khanh Tanori: Vincent Gros ?Treating Winn Muehl/Extender: Geralyn Corwin ?Weeks in Treatment: 6 ?Visit Information History Since Last Visit ?Added or deleted any medications: No ?Patient Arrived: Ambulatory ?Had a fall or experienced change in No ?Arrival Time: 08:44 ?activities of daily living that may affect ?Accompanied By: self ?risk of falls: ?Transfer Assistance: None ?Hospitalized since last visit: No ?Patient Identification Verified: Yes ?Has Dressing in Place as Prescribed: Yes ?Secondary Verification Process Completed: Yes ?Has Compression in Place as Prescribed: Yes ?Patient Requires Transmission-Based Precautions: No ?Pain Present Now: No ?Patient Has Alerts: Yes ?Patient Alerts: NOT Diabetic ?Electronic Signature(s) ?Signed: 03/23/2021 10:55:40 AM By: Hansel Feinstein ?Entered ByHansel Feinstein on 03/23/2021 08:57:46 ?ASTON, LIESKE ([id number]) ?-------------------------------------------------------------------------------- ?Encounter Discharge Information Details ?Patient Name: Paul Hanna ?Date of Service: 03/23/2021 8:30 AM ?Medical Record Number: [id number] ?Patient Account Number: [id number] ?Date of Birth/Sex: 12/05/1980 (41 y.o. M) ?Treating RN: Hansel Feinstein ?Primary Care Zarra Geffert: Vincent Gros Other Clinician: ?Referring Aavya Shafer: Vincent Gros ?Treating Reese Stockman/Extender: Geralyn Corwin ?Weeks in Treatment: 6 ?Encounter Discharge Information Items Post Procedure Vitals ?Discharge Condition: Stable ?Temperature (?F): 97.9 ?Ambulatory Status: Ambulatory ?Pulse (bpm): 89 ?Discharge Destination: Home ?Respiratory Rate (breaths/min): 16 ?Transportation:  Private Auto ?Blood Pressure (mmHg): 163/99 ?Accompanied By: self ?Schedule Follow-up Appointment: Yes ?Clinical Summary of Care: ?Electronic Signature(s) ?Signed: 03/23/2021 10:55:40 AM By: Hansel Feinstein ?Entered ByHansel Feinstein on 03/23/2021 09:39:40 ?DYRELL, TUCCILLO ([id number]) ?-------------------------------------------------------------------------------- ?Lower Extremity Assessment Details ?Patient Name: Paul Hanna ?Date of Service: 03/23/2021 8:30 AM ?Medical Record Number: [id number] ?Patient Account Number: [id number] ?Date of Birth/Sex: 19-Jan-1980 (41 y.o. M) ?Treating RN: Hansel Feinstein ?Primary Care Satchel Heidinger: Vincent Gros Other Clinician: ?Referring Drue Camera: Vincent Gros ?Treating Gaurav Baldree/Extender: Geralyn Corwin ?Weeks in Treatment: 6 ?Edema Assessment ?Assessed: [Left: Yes] [Right: No] ?[Left: Edema] [Right: :] ?Calf ?Left: Right: ?Point of Measurement: 31 cm From Medial Instep 44.5 cm ?Ankle ?Left: Right: ?Point of Measurement: 10 cm From Medial Instep 26 cm ?Electronic Signature(s) ?Signed: 03/23/2021 10:55:40 AM By: Hansel Feinstein ?Entered ByHansel Feinstein on 03/23/2021 09:06:49 ?VELTON, ROSELLE ([id number]) ?-------------------------------------------------------------------------------- ?Multi Wound Chart Details ?Patient Name: Paul Hanna ?Date of Service: 03/23/2021 8:30 AM ?Medical Record Number: [id number] ?Patient Account Number: [id number] ?Date of Birth/Sex: [birth year] (41 y.o. M) ?Treating RN: Hansel Feinstein ?Primary Care Loveda Colaizzi: Vincent Gros Other Clinician: ?Referring Hayze Gazda: Vincent Gros ?Treating Bronwyn Belasco/Extender: Geralyn Corwin ?Weeks in Treatment: 6 ?Vital Signs ?Height(in): 71 ?Pulse(bpm): 89 ?Weight(lbs): 300 ?Blood Pressure(mmHg): 163/99 ?Body Mass Index(BMI): 41.8 ?Temperature(??F): 97.9 ?Respiratory Rate(breaths/min): 16 ?Photos: [N/A:N/A] ?Wound Location: Left Lower Leg N/A N/A ?Wounding Event: Skin Tear/Laceration N/A N/A ?Primary Etiology: Trauma, Other N/A N/A ?Date  Acquired: 11/01/2020 N/A N/A ?Weeks of Treatment: 6 N/A N/A ?Wound Status: Open N/A N/A ?Wound Recurrence: No N/A N/A ?Measurements L x W x D (cm) 1x0.9x0.1 N/A N/A ?Area (cm?) : 0.707 N/A N/A ?Volume (cm?) : 0.071 N/A N/A ?% Reduction in Area: 71.40% N/A N/A ?% Reduction in Volume: 71.30% N/A N/A ?Classification: Full Thickness Without Exposed N/A N/A ?Support Structures ?Exudate Amount: Medium N/A N/A ?Exudate Type: Serosanguineous N/A N/A ?Exudate Color: red, brown N/A N/A ?Granulation Amount: Large (67-100%) N/A N/A ?Granulation Quality: Red, Hyper-granulation N/A N/A ?Necrotic Amount: Small (1-33%) N/A N/A ?Exposed Structures: ?Fat Layer (Subcutaneous Tissue): N/A N/A ?Yes ?Fascia: No ?Tendon: No ?Muscle: No ?  Joint: No ?Bone: No ?Epithelialization: None N/A N/A ?Treatment Notes ?Electronic Signature(s) ?Signed: 03/23/2021 10:55:40 AM By: Hansel Feinstein ?Entered ByHansel Feinstein on 03/23/2021 09:09:18 ?DAYMEON, FISCHMAN ([id number]) ?-------------------------------------------------------------------------------- ?Multi-Disciplinary Care Plan Details ?Patient Name: Paul Hanna ?Date of Service: 03/23/2021 8:30 AM ?Medical Record Number: [id number] ?Patient Account Number: [id number] ?Date of Birth/Sex: 08-19-1980 (41 y.o. M) ?Treating RN: Hansel Feinstein ?Primary Care Halina Asano: Vincent Gros Other Clinician: ?Referring Alezandra Egli: Vincent Gros ?Treating Larico Dimock/Extender: Geralyn Corwin ?Weeks in Treatment: 6 ?Active Inactive ?Electronic Signature(s) ?Signed: 05/02/2021 6:32:54 PM By: Elliot Gurney, BSN, RN, CWS, Kim RN, BSN ?Signed: 05/04/2021 2:19:48 PM By: Hansel Feinstein ?Previous Signature: 03/23/2021 10:55:40 AM Version By: Hansel Feinstein ?Entered By: Elliot Gurney, BSN, RN, CWS, Kim on 05/02/2021 18:32:53 ?NEILAN, RIZZO ([id number]) ?-------------------------------------------------------------------------------- ?Pain Assessment Details ?Patient Name: Paul Hanna ?Date of Service: 03/23/2021 8:30 AM ?Medical Record Number: [id number] ?Patient  Account Number: [id number] ?Date of Birth/Sex: Jan 20, 1980 (41 y.o. M) ?Treating RN: Hansel Feinstein ?Primary Care Stella Bortle: Vincent Gros Other Clinician: ?Referring Varvara Legault: Vincent Gros ?Treating Amand Lemoine/Extender: Geralyn Corwin ?Weeks in Treatment: 6 ?Active Problems ?Location of Pain Severity and Description of Pain ?Patient Has Paino No ?Site Locations ?Rate the pain. ?Current Pain Level: 0 ?Pain Management and Medication ?Current Pain Management: ?Electronic Signature(s) ?Signed: 03/23/2021 10:55:40 AM By: Hansel Feinstein ?Entered ByHansel Feinstein on 03/23/2021 08:58:38 ?BRANDUN, PINN ([id number]) ?-------------------------------------------------------------------------------- ?Patient/Caregiver Education Details ?Patient Name: BRIXON, ZHEN ?Date of Service: 03/23/2021 8:30 AM ?Medical Record Number: [id number] ?Patient Account Number: [id number] ?Date of Birth/Gender: 1980-01-31 (41 y.o. M) ?Treating RN: Hansel Feinstein ?Primary Care Physician: Vincent Gros Other Clinician: ?Referring Physician: Vincent Gros ?Treating Physician/Extender: Geralyn Corwin ?Weeks in Treatment: 6 ?Education Assessment ?Education Provided To: ?Patient ?Education Topics Provided ?Basic Hygiene: ?Tissue Oxygenation: ?Wound Debridement: ?Wound/Skin Impairment: ?Electronic Signature(s) ?Signed: 03/23/2021 10:55:40 AM By: Hansel Feinstein ?Entered ByHansel Feinstein on 03/23/2021 09:38:41 ?RESHARD, GUILLET ([id number]) ?-------------------------------------------------------------------------------- ?Wound Assessment Details ?Patient Name: NEVIN, GRIZZLE ?Date of Service: 03/23/2021 8:30 AM ?Medical Record Number: [id number] ?Patient Account Number: [id number] ?Date of Birth/Sex: 01-04-1980 (41 y.o. M) ?Treating RN: Hansel Feinstein ?Primary Care Alix Stowers: Vincent Gros Other Clinician: ?Referring Zannie Runkle: Vincent Gros ?Treating Suhana Wilner/Extender: Geralyn Corwin ?Weeks in Treatment: 6 ?Wound Status ?Wound Number: 1 ?Primary Etiology: Trauma, Other ?Wound  Location: Left Lower Leg ?Wound Status: Open ?Wounding Event: Skin Tear/Laceration ?Date Acquired: 11/01/2020 ?Weeks Of Treatment: 6 ?Clustered Wound: No ?Photos ?Wound Measurements ?Length: (cm) 1 ?Width: (cm) 0.9 ?

## 2021-03-30 ENCOUNTER — Encounter: Payer: BC Managed Care – PPO | Admitting: Internal Medicine

## 2021-04-06 ENCOUNTER — Encounter: Payer: BC Managed Care – PPO | Admitting: Internal Medicine

## 2021-04-13 ENCOUNTER — Encounter: Payer: BC Managed Care – PPO | Admitting: Internal Medicine

## 2021-04-20 ENCOUNTER — Encounter: Payer: BC Managed Care – PPO | Admitting: Internal Medicine

## 2021-04-27 ENCOUNTER — Encounter: Payer: BC Managed Care – PPO | Admitting: Internal Medicine
